# Patient Record
Sex: Female | Born: 1950 | Race: White | Hispanic: No | Marital: Married | Smoking: Never smoker
Health system: Southern US, Community
[De-identification: ages and names within clinical notes are randomized; demographics above are authoritative.]

## PROBLEM LIST (undated history)

## (undated) DIAGNOSIS — I10 Essential (primary) hypertension: Secondary | ICD-10-CM

## (undated) DIAGNOSIS — Q2112 Patent foramen ovale: Secondary | ICD-10-CM

## (undated) DIAGNOSIS — E785 Hyperlipidemia, unspecified: Secondary | ICD-10-CM

## (undated) DIAGNOSIS — R7303 Prediabetes: Secondary | ICD-10-CM

## (undated) DIAGNOSIS — R0600 Dyspnea, unspecified: Secondary | ICD-10-CM

## (undated) DIAGNOSIS — J45909 Unspecified asthma, uncomplicated: Secondary | ICD-10-CM

## (undated) HISTORY — DX: Patent foramen ovale: Q21.12

## (undated) HISTORY — DX: Essential (primary) hypertension: I10

## (undated) HISTORY — DX: Hyperlipidemia, unspecified: E78.5

## (undated) HISTORY — DX: Dyspnea, unspecified: R06.00

## (undated) HISTORY — DX: Morbid (severe) obesity due to excess calories: E66.01

## (undated) HISTORY — DX: Unspecified asthma, uncomplicated: J45.909

## (undated) HISTORY — DX: Prediabetes: R73.03

---

## 2017-08-18 DIAGNOSIS — J101 Influenza due to other identified influenza virus with other respiratory manifestations: Secondary | ICD-10-CM

## 2017-08-18 DIAGNOSIS — J22 Unspecified acute lower respiratory infection: Secondary | ICD-10-CM

## 2017-08-18 DIAGNOSIS — R0902 Hypoxemia: Secondary | ICD-10-CM

## 2017-08-18 DIAGNOSIS — R030 Elevated blood-pressure reading, without diagnosis of hypertension: Secondary | ICD-10-CM

## 2017-08-19 DIAGNOSIS — R0902 Hypoxemia: Secondary | ICD-10-CM | POA: Diagnosis not present

## 2017-08-19 DIAGNOSIS — R030 Elevated blood-pressure reading, without diagnosis of hypertension: Secondary | ICD-10-CM | POA: Diagnosis not present

## 2017-08-19 DIAGNOSIS — J22 Unspecified acute lower respiratory infection: Secondary | ICD-10-CM | POA: Diagnosis not present

## 2017-08-19 DIAGNOSIS — J101 Influenza due to other identified influenza virus with other respiratory manifestations: Secondary | ICD-10-CM | POA: Diagnosis not present

## 2017-08-20 DIAGNOSIS — R030 Elevated blood-pressure reading, without diagnosis of hypertension: Secondary | ICD-10-CM | POA: Diagnosis not present

## 2017-08-20 DIAGNOSIS — J22 Unspecified acute lower respiratory infection: Secondary | ICD-10-CM | POA: Diagnosis not present

## 2017-08-20 DIAGNOSIS — R0902 Hypoxemia: Secondary | ICD-10-CM | POA: Diagnosis not present

## 2017-08-20 DIAGNOSIS — J101 Influenza due to other identified influenza virus with other respiratory manifestations: Secondary | ICD-10-CM | POA: Diagnosis not present

## 2017-08-21 DIAGNOSIS — J22 Unspecified acute lower respiratory infection: Secondary | ICD-10-CM | POA: Diagnosis not present

## 2017-08-21 DIAGNOSIS — R030 Elevated blood-pressure reading, without diagnosis of hypertension: Secondary | ICD-10-CM | POA: Diagnosis not present

## 2017-08-21 DIAGNOSIS — J101 Influenza due to other identified influenza virus with other respiratory manifestations: Secondary | ICD-10-CM | POA: Diagnosis not present

## 2017-08-21 DIAGNOSIS — R0902 Hypoxemia: Secondary | ICD-10-CM | POA: Diagnosis not present

## 2017-08-22 DIAGNOSIS — R0902 Hypoxemia: Secondary | ICD-10-CM | POA: Diagnosis not present

## 2017-08-22 DIAGNOSIS — R030 Elevated blood-pressure reading, without diagnosis of hypertension: Secondary | ICD-10-CM | POA: Diagnosis not present

## 2017-08-22 DIAGNOSIS — J101 Influenza due to other identified influenza virus with other respiratory manifestations: Secondary | ICD-10-CM | POA: Diagnosis not present

## 2017-08-22 DIAGNOSIS — J22 Unspecified acute lower respiratory infection: Secondary | ICD-10-CM | POA: Diagnosis not present

## 2017-08-23 DIAGNOSIS — J22 Unspecified acute lower respiratory infection: Secondary | ICD-10-CM | POA: Diagnosis not present

## 2017-08-23 DIAGNOSIS — R030 Elevated blood-pressure reading, without diagnosis of hypertension: Secondary | ICD-10-CM | POA: Diagnosis not present

## 2017-08-23 DIAGNOSIS — R0902 Hypoxemia: Secondary | ICD-10-CM | POA: Diagnosis not present

## 2017-08-23 DIAGNOSIS — J101 Influenza due to other identified influenza virus with other respiratory manifestations: Secondary | ICD-10-CM | POA: Diagnosis not present

## 2017-08-24 DIAGNOSIS — R030 Elevated blood-pressure reading, without diagnosis of hypertension: Secondary | ICD-10-CM | POA: Diagnosis not present

## 2017-08-24 DIAGNOSIS — J22 Unspecified acute lower respiratory infection: Secondary | ICD-10-CM | POA: Diagnosis not present

## 2017-08-24 DIAGNOSIS — J101 Influenza due to other identified influenza virus with other respiratory manifestations: Secondary | ICD-10-CM | POA: Diagnosis not present

## 2017-08-24 DIAGNOSIS — R0902 Hypoxemia: Secondary | ICD-10-CM | POA: Diagnosis not present

## 2017-08-25 DIAGNOSIS — R030 Elevated blood-pressure reading, without diagnosis of hypertension: Secondary | ICD-10-CM | POA: Diagnosis not present

## 2017-08-25 DIAGNOSIS — J101 Influenza due to other identified influenza virus with other respiratory manifestations: Secondary | ICD-10-CM | POA: Diagnosis not present

## 2017-08-25 DIAGNOSIS — R0902 Hypoxemia: Secondary | ICD-10-CM | POA: Diagnosis not present

## 2017-08-25 DIAGNOSIS — J22 Unspecified acute lower respiratory infection: Secondary | ICD-10-CM | POA: Diagnosis not present

## 2019-08-30 DIAGNOSIS — I2510 Atherosclerotic heart disease of native coronary artery without angina pectoris: Secondary | ICD-10-CM | POA: Insufficient documentation

## 2019-08-30 HISTORY — DX: Atherosclerotic heart disease of native coronary artery without angina pectoris: I25.10

## 2019-12-31 DIAGNOSIS — I351 Nonrheumatic aortic (valve) insufficiency: Secondary | ICD-10-CM | POA: Diagnosis not present

## 2020-04-20 ENCOUNTER — Other Ambulatory Visit: Payer: Self-pay

## 2020-04-20 DIAGNOSIS — R0600 Dyspnea, unspecified: Secondary | ICD-10-CM | POA: Insufficient documentation

## 2020-04-20 DIAGNOSIS — J45909 Unspecified asthma, uncomplicated: Secondary | ICD-10-CM | POA: Insufficient documentation

## 2020-04-20 DIAGNOSIS — R7303 Prediabetes: Secondary | ICD-10-CM | POA: Insufficient documentation

## 2020-04-21 NOTE — Progress Notes (Signed)
Cardiology Office Note:    Date:  04/22/2020   ID:  Kristin Patel, DOB 1951-07-11, MRN [id number]  PCP:  Gordy Councilman, FNP  Cardiologist:  Norman Herrlich, MD   Referring MD: Marcellus Scott, MD  ASSESSMENT:    1. Shortness of breath   2. Intermittent asthma, unspecified asthma severity, unspecified whether complicated   3. Morbid obesity (HCC)   4. Coronary artery calcification seen on CAT scan    PLAN:    In order of problems listed above:  1. She has asthma presently improved with her bronchodilators, I reviewed her echocardiogram reviewed the patient is normal for age.  I find nothing clinically to tell me she has heart failure recheck a proBNP level and also do a D-dimer to screen her for venous thromboembolism.  For completeness I asked her to repeat a chest x-ray has been 1 year with her occupational exposure I will do routine labs including a CBC and thyroids recheck a proBNP level.  I will ask her to come back and see me in the office afterwards in 6 weeks.  At this time I would recommend left or right heart catheterization. 2. Improved continue current bronchodilator 3. May be related to her shortness of breath with obesity related pulmonary disease 4. I accessed a chest CTA she had done at [facility] 08/20/2017 she had three-vessel coronary artery calcification and findings of diffuse bronchial wall thickening with peribronchial opacification and some areas of groundglass opacity upper lobes that were felt to be due to most likely air trapping.  I spoke with the patient and told her this change my opinion her shortness of breath may be anginal equivalent I think she is at higher risk than I initially thought with her prediabetes and we discussed evaluation for CAD and should be set up for cardiac CTA to be performed in [place] and also give [place] a follow-up CT scan of her chest to look for any chronic scarring and will cancel the chest x-ray initially to order.  I discussed  this with the patient shared decision making she agrees and opts for CTA of the chest.  Next appointment 6 weeks  Medication Adjustments/Labs and Tests Ordered: Current medicines are reviewed at length with the patient today.  Concerns regarding medicines are outlined above.  Orders Placed This Encounter  Procedures  . Pro b natriuretic peptide (BNP)  . TSH+T4F+T3Free  . Comprehensive metabolic panel  . D-dimer, quantitative (not at [facility])  . EKG 12-Lead   No orders of the defined types were placed in this encounter.    No chief complaint on file.   History of Present Illness:    Kristin Patel is a 69 y.o. female who is being seen today for the evaluation of shortness of breath and low oxygen.  At the request of Marcellus Scott, MD.  I reviewed notes from pulmonary office 03/18/2020 history of asthma. She has exertional shortness of breath. She had PFTs performed which showed restrictive lung disease with a differential diagnosis of heart failure sarcoidosis and pulmonary fibrosis. She had an echocardiogram at [facility] 12/31/2019. Left ventricular size and function was normal right ventricular size and function was normal. Both the left and right atrium were normal in size. Valvular abnormality included mild aortic regurgitation.  Onset 3 years ago admitted to the hospital with influenza she tells me she was there for 1 week she received antibiotics oxygen supplemental with a low oxygen saturation and ever since then she has been short  of breath and is developed asthma.  Recently she is better using Advair has not needed her rescue inhaler wheezes very little and she short of breath only when she uses her upper extremities doing heavy housework.  No edema orthopnea chest pain palpitation or syncope is not checking her oxygen sats at home regularly in my office today ambulating her lowest oxygen saturation is 95%.  She had echocardiogram performed at [facility] that is normal for  age.  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  Her husband was in the [employer] he has no occupational exposure to asbestos.  She has no known history of congenital or rheumatic heart disease.  She does not snore has not had a sleep study performed.  Only medication at this time is her Advair inhaler.  I accessed the [facility] record from 08/25/2017.  Troponins were checked they were normal there is a normal proBNP level of 98 her discharge diagnosis was influenza A with acute respiratory failure requiring supplemental oxygen initially 10 L nasal cannula.  There is no chest x-ray report.  CBC showed a hemoglobin of 13.9 Past Medical History:  Diagnosis Date  . Asthma   . Dyspnea   . Morbid obesity (HCC)   . Pre-diabetes     Past Surgical History:  Procedure Laterality Date  . CESAREAN SECTION      Current Medications: Current Meds  Medication Sig  . Aspirin-Acetaminophen-Caffeine (GOODY HEADACHE PO) Take 1 packet by mouth every 6 (six) hours as needed.  . Fluticasone-Salmeterol (ADVAIR DISKUS) 100-50 MCG/DOSE AEPB Inhale 2 puffs into the lungs 2 (two) times daily.  . Ibuprofen 200 MG CAPS Take 1 capsule by mouth every 6 (six) hours as needed.  . Multiple Vitamins-Minerals (CENTRUM SILVER 50+WOMEN) TABS Take 1 tablet by mouth daily.     Allergies:   Patient has no known allergies.   Social History   Socioeconomic History  . Marital status: Married    Spouse name: Not on file  . Number of children: Not on file  . Years of education: Not on file  . Highest education level: Not on file  Occupational History  . Not on file  Tobacco Use  . Smoking status: Never Smoker  . Smokeless tobacco: Never Used  Substance and Sexual Activity  . Alcohol use: Never  . Drug use: Never  . Sexual activity: Not on file  Other Topics Concern   . Not on file  Social History Narrative  . Not on file   Social Determinants of Health   Financial Resource Strain:   . Difficulty of Paying Living Expenses: Not on file  Food Insecurity:   . Worried About Programme researcher, broadcasting/film/video in the Last Year: Not on file  . Ran Out of Food in the Last Year: Not on file  Transportation Needs:   . Lack of Transportation (Medical): Not on file  . Lack of Transportation (Non-Medical): Not on file  Physical Activity:   . Days of Exercise per Week: Not on file  . Minutes of Exercise per Session: Not on file  Stress:   . Feeling of Stress : Not on file  Social Connections:   . Frequency of Communication with Friends and Family: Not on file  . Frequency of Social Gatherings with Friends and Family: Not on file  . Attends Religious  Services: Not on file  . Active Member of Clubs or Organizations: Not on file  . Attends Banker Meetings: Not on file  . Marital Status: Not on file     Family History: The patient's family history includes Congestive Heart Failure in her mother; Diabetes in her sister; Heart disease in her brother; Hypertension in her mother; Kidney cancer in her sister; Pancreatic cancer in her father.  ROS:   ROS Please see the history of present illness.     All other systems reviewed and are negative.  EKGs/Labs/Other Studies Reviewed:    The following studies were reviewed today:   EKG:  EKG is  ordered today.  The ekg ordered today is personally reviewed and demonstrates sinus rhythm abnormal R wave progression consider previous anterior MI  Recent Labs: She relates no recent lab work in the last year  Physical Exam:    VS:  BP (!) 146/84   Pulse 86   Ht 5\' 4"  (1.626 m)   Wt 214 lb 12.8 oz (97.4 kg)   SpO2 95%   BMI 36.87 kg/m     Wt Readings from Last 3 Encounters:  04/22/20 214 lb 12.8 oz (97.4 kg)     GEN: Does not look chronically ill well nourished, well developed in no acute distress HEENT:  Normal NECK: No JVD; No carotid bruits LYMPHATICS: No lymphadenopathy CARDIAC: No murmur RRR, no murmurs, rubs, gallops RESPIRATORY:  Clear to auscultation without rales, wheezing or rhonchi  ABDOMEN: Soft, non-tender, non-distended MUSCULOSKELETAL:  No edema; No deformity  SKIN: Warm and dry NEUROLOGIC:  Alert and oriented x 3 PSYCHIATRIC:  Normal affect     Signed, 04/24/20, MD  04/22/2020 9:28 AM    [facility]

## 2020-04-22 ENCOUNTER — Other Ambulatory Visit: Payer: Self-pay

## 2020-04-22 ENCOUNTER — Ambulatory Visit (INDEPENDENT_AMBULATORY_CARE_PROVIDER_SITE_OTHER): Payer: Medicare Other | Admitting: Cardiology

## 2020-04-22 ENCOUNTER — Other Ambulatory Visit: Payer: Self-pay | Admitting: Cardiology

## 2020-04-22 ENCOUNTER — Encounter: Payer: Self-pay | Admitting: Cardiology

## 2020-04-22 VITALS — BP 146/84 | HR 86 | Ht 64.0 in | Wt 214.8 lb

## 2020-04-22 DIAGNOSIS — J4520 Mild intermittent asthma, uncomplicated: Secondary | ICD-10-CM

## 2020-04-22 DIAGNOSIS — I2510 Atherosclerotic heart disease of native coronary artery without angina pectoris: Secondary | ICD-10-CM | POA: Diagnosis not present

## 2020-04-22 DIAGNOSIS — Z8349 Family history of other endocrine, nutritional and metabolic diseases: Secondary | ICD-10-CM

## 2020-04-22 DIAGNOSIS — R931 Abnormal findings on diagnostic imaging of heart and coronary circulation: Secondary | ICD-10-CM

## 2020-04-22 DIAGNOSIS — R0602 Shortness of breath: Secondary | ICD-10-CM

## 2020-04-22 DIAGNOSIS — Z1329 Encounter for screening for other suspected endocrine disorder: Secondary | ICD-10-CM

## 2020-04-22 MED ORDER — METOPROLOL TARTRATE 100 MG PO TABS: 100.0000 mg | ORAL_TABLET | Freq: Once | ORAL | 0 refills | Status: DC

## 2020-04-22 NOTE — Patient Instructions (Addendum)
Medication Instructions:  Your physician recommends that you continue on your current medications as directed. Please refer to the Current Medication list given to you today.  *If you need a refill on your cardiac medications before your next appointment, please call your pharmacy*   Lab Work: Your physician recommends that you return for lab work in: TODAY D-dimer, ProBNP, TSH, T3, T4, CMP  Within one week of your cardiac CT- BMP If you have labs (blood work) drawn today and your tests are completely normal, you will receive your results only by: Marland Kitchen MyChart Message (if you have MyChart) OR . A paper copy in the mail If you have any lab test that is abnormal or we need to change your treatment, we will call you to review the results.   Testing/Procedures: Your cardiac CT will be scheduled at the below location:   [facility] [address] [place], [place] [address] [phone]  If scheduled at [facility], please arrive at the [facility] main entrance of [facility] 30 minutes prior to test start time. Proceed to the [facility] Radiology Department (first floor) to check-in and test prep.  Please follow these instructions carefully (unless otherwise directed):  On the Night Before the Test: . Be sure to Drink plenty of water. . Do not consume any caffeinated/decaffeinated beverages or chocolate 12 hours prior to your test. . Do not take any antihistamines 12 hours prior to your test.  On the Day of the Test: . Drink plenty of water. Do not drink any water within one hour of the test. . Do not eat any food 4 hours prior to the test. . You may take your regular medications prior to the test.  . Take metoprolol (Lopressor) two hours prior to test. . FEMALES- please wear underwire-free bra if available       After the Test: . Drink plenty of water. . After receiving IV contrast, you may experience a mild flushed feeling. This is normal. . On  occasion, you may experience a mild rash up to 24 hours after the test. This is not dangerous. If this occurs, you can take Benadryl 25 mg and increase your fluid intake. . If you experience trouble breathing, this can be serious. If it is severe call 911 IMMEDIATELY. If it is mild, please call our office. . If you take any of these medications: Glipizide/Metformin, Avandament, Glucavance, please do not take 48 hours after completing test unless otherwise instructed.   Once we have confirmed authorization from your insurance company, we will call you to set up a date and time for your test. Based on how quickly your insurance processes prior authorizations requests, please allow up to 4 weeks to be contacted for scheduling your Cardiac CT appointment. Be advised that routine Cardiac CT appointments could be scheduled as many as 8 weeks after your provider has ordered it.  For non-scheduling related questions, please contact the cardiac imaging nurse navigator should you have any questions/concerns: Marchia Bond, Cardiac Imaging Nurse Navigator Burley Saver, Interim Cardiac Imaging Nurse [place] [facility] Direct Office Dial: [phone]   For scheduling needs, including cancellations and rescheduling, please call Vivien Rota at [phone], option 3.      Follow-Up: At [facility], you and your health needs are our priority.  As part of our continuing mission to provide you with exceptional heart care, we have created designated Provider Care Teams.  These Care Teams include your primary  Cardiologist (physician) and Advanced Practice Providers (APPs -  Physician Assistants and Nurse Practitioners) who all work together to provide you with the care you need, when you need it.  We recommend signing up for the patient portal called "MyChart".  Sign up information is provided on this After Visit Summary.  MyChart is used to connect with patients for Virtual Visits  (Telemedicine).  Patients are able to view lab/test results, encounter notes, upcoming appointments, etc.  Non-urgent messages can be sent to your provider as well.   To learn more about what you can do with MyChart, go to [email or web address].    Your next appointment:   6 week(s)  The format for your next appointment:   In Person  Provider:   Shirlee More, MD   Other Instructions

## 2020-04-23 LAB — COMPREHENSIVE METABOLIC PANEL
ALT: 16 IU/L (ref 0–32)
AST: 17 IU/L (ref 0–40)
Albumin/Globulin Ratio: 1.5 (ref 1.2–2.2)
Albumin: 4.5 g/dL (ref 3.8–4.8)
Alkaline Phosphatase: 77 IU/L (ref 48–121)
BUN/Creatinine Ratio: 17 (ref 12–28)
BUN: 11 mg/dL (ref 8–27)
Bilirubin Total: <0.2 mg/dL (ref 0.0–1.2)
CO2: 22 mmol/L (ref 20–29)
Calcium: 9.9 mg/dL (ref 8.7–10.3)
Chloride: 102 mmol/L (ref 96–106)
Creatinine, Ser: 0.66 mg/dL (ref 0.57–1.00)
GFR calc Af Amer: 104 mL/min/{1.73_m2} (ref >59)
GFR calc non Af Amer: 90 mL/min/{1.73_m2} (ref >59)
Globulin, Total: 3.1 g/dL (ref 1.5–4.5)
Glucose: 119 mg/dL — ABNORMAL HIGH (ref 65–99)
Potassium: 4.5 mmol/L (ref 3.5–5.2)
Sodium: 140 mmol/L (ref 134–144)
Total Protein: 7.6 g/dL (ref 6.0–8.5)

## 2020-04-23 LAB — D-DIMER, QUANTITATIVE: D-DIMER: 0.82 mg/L FEU — ABNORMAL HIGH (ref 0.00–0.49)

## 2020-04-23 LAB — TSH+T4F+T3FREE
Free T4: 0.84 ng/dL (ref 0.82–1.77)
T3, Free: 2.6 pg/mL (ref 2.0–4.4)
TSH: 3.34 u[IU]/mL (ref 0.450–4.500)

## 2020-04-23 LAB — PRO B NATRIURETIC PEPTIDE: NT-Pro BNP: 52 pg/mL (ref 0–301)

## 2020-04-27 ENCOUNTER — Telehealth: Payer: Self-pay

## 2020-04-27 NOTE — Telephone Encounter (Signed)
-----   Message from Baldo Daub, MD sent at 04/27/2020  7:51 AM EDT ----- Good results except D-dimer is elevated for age  I think she should have a CTA done to exclude pulmonary embolism.

## 2020-04-27 NOTE — Telephone Encounter (Signed)
Spoke with patient regarding results and recommendation.  Patient verbalizes understanding and is agreeable to plan of care. Advised patient to call back with any issues or concerns.  

## 2020-05-01 ENCOUNTER — Telehealth: Payer: Self-pay

## 2020-05-01 NOTE — Telephone Encounter (Signed)
Per Dr. Dulce Sellar:  CTA of chest [facility] 04/28/2020 good result no findings of pulmonary embolism  Spoke with patient regarding results and recommendation.  Patient verbalizes understanding and is agreeable to plan of care. Advised patient to call back with any issues or concerns.

## 2020-05-07 ENCOUNTER — Telehealth (HOSPITAL_COMMUNITY): Payer: Self-pay | Admitting: *Deleted

## 2020-05-07 ENCOUNTER — Telehealth: Payer: Self-pay

## 2020-05-07 DIAGNOSIS — Z79899 Other long term (current) drug therapy: Secondary | ICD-10-CM

## 2020-05-07 LAB — BASIC METABOLIC PANEL
BUN/Creatinine Ratio: 19 (ref 12–28)
BUN: 12 mg/dL (ref 8–27)
CO2: 21 mmol/L (ref 20–29)
Calcium: 9.9 mg/dL (ref 8.7–10.3)
Chloride: 100 mmol/L (ref 96–106)
Creatinine, Ser: 0.63 mg/dL (ref 0.57–1.00)
GFR calc Af Amer: 105 mL/min/{1.73_m2} (ref >59)
GFR calc non Af Amer: 91 mL/min/{1.73_m2} (ref >59)
Glucose: 118 mg/dL — ABNORMAL HIGH (ref 65–99)
Potassium: 4.5 mmol/L (ref 3.5–5.2)
Sodium: 139 mmol/L (ref 134–144)

## 2020-05-07 NOTE — Telephone Encounter (Signed)
Pt here for labs for CT tomorrow

## 2020-05-07 NOTE — Telephone Encounter (Signed)

## 2020-05-07 NOTE — Telephone Encounter (Signed)
Left message on patients voicemail letting her know her lab results and also gave her our call back number in case she had any questions or concerns.

## 2020-05-08 ENCOUNTER — Ambulatory Visit (HOSPITAL_COMMUNITY)
Admission: RE | Admit: 2020-05-08 | Discharge: 2020-05-08 | Disposition: A | Discharge status: Home / Self Care | Payer: Medicare Other | Source: Ambulatory Visit | Attending: Cardiology | Admitting: Cardiology

## 2020-05-08 ENCOUNTER — Other Ambulatory Visit: Payer: Self-pay

## 2020-05-08 DIAGNOSIS — I2510 Atherosclerotic heart disease of native coronary artery without angina pectoris: Secondary | ICD-10-CM

## 2020-05-08 DIAGNOSIS — J4520 Mild intermittent asthma, uncomplicated: Secondary | ICD-10-CM

## 2020-05-08 DIAGNOSIS — R0602 Shortness of breath: Secondary | ICD-10-CM

## 2020-05-08 DIAGNOSIS — I700 Atherosclerosis of aorta: Secondary | ICD-10-CM

## 2020-05-08 MED ORDER — IOHEXOL 350 MG/ML SOLN
80.0000 mL | Freq: Once | INTRAVENOUS | Status: AC | PRN
  Administered 2020-05-08: 80 mL via INTRAVENOUS

## 2020-05-08 MED ORDER — NITROGLYCERIN 0.4 MG SL SUBL
SUBLINGUAL_TABLET | SUBLINGUAL | Status: AC
  Filled 2020-05-08: qty 2

## 2020-05-08 MED ORDER — NITROGLYCERIN 0.4 MG SL SUBL
0.8000 mg | SUBLINGUAL_TABLET | Freq: Once | SUBLINGUAL | Status: AC
  Administered 2020-05-08: 0.8 mg via SUBLINGUAL

## 2020-05-11 ENCOUNTER — Telehealth: Payer: Self-pay

## 2020-05-11 MED ORDER — ATORVASTATIN CALCIUM 20 MG PO TABS: 20.0000 mg | ORAL_TABLET | Freq: Every day | ORAL | 3 refills | Status: DC

## 2020-05-11 NOTE — Telephone Encounter (Signed)
Spoke with patient regarding results and recommendation.  Patient verbalizes understanding and is agreeable to plan of care. Advised patient to call back with any issues or concerns.  

## 2020-05-11 NOTE — Telephone Encounter (Signed)
-----   Message from Thomasene Ripple, DO sent at 05/10/2020 12:55 PM EDT -----  You do blockages in your heart.  Nothing enough to warrant any further testing.   I want you to take aspirin 81 mg daily as well as started on Lipitor 20 mg daily.   There is incidental finding of patent foramen ovale - which we do need to do anything further about.   Dr. Dulce Sellar with discussed this with you more detail at your next visit.   I will also for your CT to your PCP   there are some noncardiac findings in there that needs to be follow-up

## 2020-06-08 NOTE — Progress Notes (Signed)
Cardiology Office Note:    Date:  06/09/2020   ID:  Kristin Patel, DOB 1950-10-28, MRN [id number]  PCP:  Gordy Councilman, FNP  Cardiologist:  Norman Herrlich, MD    Referring MD: Gordy Councilman, FNP    ASSESSMENT:    1. Mild CAD   2. Shortness of breath   3. Hyperlipidemia, unspecified hyperlipidemia type   4. Hypertension, unspecified type    PLAN:    In order of problems listed above:  1. Cardiac CTA shows mild nonobstructive CAD she has exertional shortness of breath which is multifactorial but may be related with microvascular coronary disease initiate treatment aspirin continue statin started last month and rate limiting calcium channel blocker for coronary vasodilation.  To be given a prescription for nitroglycerin to have at home. 2. Multifactorial with asthma obesity but also mild CAD see above 3. Continue statin with CAD check lipid profile CMP 4. Initiate antihypertensive therapy with stage II hypertension   Next appointment: 6 months   Medication Adjustments/Labs and Tests Ordered: Current medicines are reviewed at length with the patient today.  Concerns regarding medicines are outlined above.  No orders of the defined types were placed in this encounter.  No orders of the defined types were placed in this encounter.   Chief Complaint  Patient presents with  . Follow-up    After cardiac CTA    History of Present Illness:    Kristin Patel is a 69 y.o. female with a hx of asthma abnormal PFTs with restrictive pattern and coronary artery calcification on chest CT performed at [facility] in December 2018. Marland Kitchen She was last seen 04/22/2020. She had an echocardiogram at [facility] 12/31/2019. Left ventricular size and function was normal right ventricular size and function was normal. Both the left and right atrium were normal in size and mild aortic regurgitation. Her N-terminal proBNP level was very low not consistent with heart failure and D-dimer was  elevated for age.  Compliance with diet, lifestyle and medications: Yes  Reviewed her CTA findings with her.  She is not having angina but with physical effort she is short of breath at rest and is recovered this could be related to her CAD blood pressure me rechecking is 162/90 we will continue treatment low-dose aspirin statin check lipids today initiate antihypertensive with a rate limiting calcium channel blocker of mild CAD and asthma.  I have asked her to check blood pressure at home daily for few weeks and drop a list to my office.  For further evaluation she underwent a cardiac CTA 05/08/2020 with a calcium score of 35, 66 percentile for age and sex and mild nonobstructive CAD.  There was also abnormality noted in the left breast similar to a previous study.  She had a CTA of the chest 04/29/2020 pulmonary embolism protocol [facility] health showing no evidence of pulmonary embolism and normal lung appearance.  Past Medical History:  Diagnosis Date  . Asthma   . Dyspnea   . Morbid obesity (HCC)   . Pre-diabetes     Past Surgical History:  Procedure Laterality Date  . CESAREAN SECTION      Current Medications: Current Meds  Medication Sig  . Aspirin-Acetaminophen-Caffeine (GOODY HEADACHE PO) Take 1 packet by mouth every 6 (six) hours as needed.  Marland Kitchen atorvastatin (LIPITOR) 20 MG tablet Take 1 tablet (20 mg total) by mouth daily.  . Fluticasone-Salmeterol (ADVAIR DISKUS) 100-50 MCG/DOSE AEPB Inhale 2 puffs into the lungs 2 (two) times daily.  Marland Kitchen  Ibuprofen 200 MG CAPS Take 1 capsule by mouth every 6 (six) hours as needed.  . Multiple Vitamins-Minerals (CENTRUM SILVER 50+WOMEN) TABS Take 1 tablet by mouth daily.     Allergies:   Patient has no known allergies.   Social History   Socioeconomic History  . Marital status: Married    Spouse name: Not on file  . Number of children: Not on file  . Years of education: Not on file  . Highest education level: Not on file  Occupational  History  . Not on file  Tobacco Use  . Smoking status: Never Smoker  . Smokeless tobacco: Never Used  Substance and Sexual Activity  . Alcohol use: Never  . Drug use: Never  . Sexual activity: Not on file  Other Topics Concern  . Not on file  Social History Narrative  . Not on file   Social Determinants of Health   Financial Resource Strain:   . Difficulty of Paying Living Expenses: Not on file  Food Insecurity:   . Worried About Programme researcher, broadcasting/film/video in the Last Year: Not on file  . Ran Out of Food in the Last Year: Not on file  Transportation Needs:   . Lack of Transportation (Medical): Not on file  . Lack of Transportation (Non-Medical): Not on file  Physical Activity:   . Days of Exercise per Week: Not on file  . Minutes of Exercise per Session: Not on file  Stress:   . Feeling of Stress : Not on file  Social Connections:   . Frequency of Communication with Friends and Family: Not on file  . Frequency of Social Gatherings with Friends and Family: Not on file  . Attends Religious Services: Not on file  . Active Member of Clubs or Organizations: Not on file  . Attends Banker Meetings: Not on file  . Marital Status: Not on file     Family History: The patient's family history includes Congestive Heart Failure in her mother; Diabetes in her sister; Heart disease in her brother; Hypertension in her mother; Kidney cancer in her sister; Pancreatic cancer in her father. ROS:   Please see the history of present illness.    All other systems reviewed and are negative.  EKGs/Labs/Other Studies Reviewed:    The following studies were reviewed today:    Recent Labs: 04/22/2020: ALT 16; NT-Pro BNP 52; TSH 3.340 05/07/2020: BUN 12; Creatinine, Ser 0.63; Potassium 4.5; Sodium 139  Recent Lipid Panel No results found for: CHOL, TRIG, HDL, CHOLHDL, VLDL, LDLCALC, LDLDIRECT  Physical Exam:    VS:  BP (!) 145/85   Pulse 80   Ht 5\' 4"  (1.626 m)   Wt 213 lb 6.4 oz  (96.8 kg)   SpO2 92%   BMI 36.63 kg/m     Wt Readings from Last 3 Encounters:  06/09/20 213 lb 6.4 oz (96.8 kg)  04/22/20 214 lb 12.8 oz (97.4 kg)     GEN:  Well nourished, well developed in no acute distress HEENT: Normal NECK: No JVD; No carotid bruits LYMPHATICS: No lymphadenopathy CARDIAC: RRR, no murmurs, rubs, gallops RESPIRATORY:  Clear to auscultation without rales, wheezing or rhonchi  ABDOMEN: Soft, non-tender, non-distended MUSCULOSKELETAL:  No edema; No deformity  SKIN: Warm and dry NEUROLOGIC:  Alert and oriented x 3 PSYCHIATRIC:  Normal affect    Signed, 04/24/20, MD  06/09/2020 8:58 AM    [facility]

## 2020-06-09 ENCOUNTER — Ambulatory Visit (INDEPENDENT_AMBULATORY_CARE_PROVIDER_SITE_OTHER): Payer: Medicare Other | Admitting: Cardiology

## 2020-06-09 ENCOUNTER — Encounter: Payer: Self-pay | Admitting: Cardiology

## 2020-06-09 ENCOUNTER — Other Ambulatory Visit: Payer: Self-pay

## 2020-06-09 VITALS — BP 145/85 | HR 80 | Ht 64.0 in | Wt 213.4 lb

## 2020-06-09 DIAGNOSIS — E785 Hyperlipidemia, unspecified: Secondary | ICD-10-CM | POA: Diagnosis not present

## 2020-06-09 DIAGNOSIS — I2510 Atherosclerotic heart disease of native coronary artery without angina pectoris: Secondary | ICD-10-CM

## 2020-06-09 DIAGNOSIS — R0602 Shortness of breath: Secondary | ICD-10-CM

## 2020-06-09 DIAGNOSIS — I10 Essential (primary) hypertension: Secondary | ICD-10-CM

## 2020-06-09 LAB — COMPREHENSIVE METABOLIC PANEL
ALT: 18 IU/L (ref 0–32)
AST: 15 IU/L (ref 0–40)
Albumin/Globulin Ratio: 1.8 (ref 1.2–2.2)
Albumin: 4.6 g/dL (ref 3.8–4.8)
Alkaline Phosphatase: 85 IU/L (ref 44–121)
BUN/Creatinine Ratio: 18 (ref 12–28)
BUN: 12 mg/dL (ref 8–27)
Bilirubin Total: 0.3 mg/dL (ref 0.0–1.2)
CO2: 25 mmol/L (ref 20–29)
Calcium: 9.7 mg/dL (ref 8.7–10.3)
Chloride: 101 mmol/L (ref 96–106)
Creatinine, Ser: 0.67 mg/dL (ref 0.57–1.00)
GFR calc Af Amer: 103 mL/min/{1.73_m2} (ref >59)
GFR calc non Af Amer: 89 mL/min/{1.73_m2} (ref >59)
Globulin, Total: 2.5 g/dL (ref 1.5–4.5)
Glucose: 120 mg/dL — ABNORMAL HIGH (ref 65–99)
Potassium: 4.6 mmol/L (ref 3.5–5.2)
Sodium: 141 mmol/L (ref 134–144)
Total Protein: 7.1 g/dL (ref 6.0–8.5)

## 2020-06-09 LAB — LIPID PANEL
Chol/HDL Ratio: 3.3 ratio (ref 0.0–4.4)
Cholesterol, Total: 145 mg/dL (ref 100–199)
HDL: 44 mg/dL (ref >39)
LDL Chol Calc (NIH): 71 mg/dL (ref 0–99)
Triglycerides: 179 mg/dL — ABNORMAL HIGH (ref 0–149)
VLDL Cholesterol Cal: 30 mg/dL (ref 5–40)

## 2020-06-09 MED ORDER — NITROGLYCERIN 0.4 MG SL SUBL: 0.4000 mg | SUBLINGUAL_TABLET | SUBLINGUAL | 3 refills | Status: DC | PRN

## 2020-06-09 MED ORDER — DILTIAZEM HCL ER COATED BEADS 180 MG PO CP24: 180.0000 mg | ORAL_CAPSULE | Freq: Every day | ORAL | 3 refills | Status: DC

## 2020-06-09 NOTE — Patient Instructions (Addendum)
Medication Instructions:  Your physician has recommended you make the following change in your medication:  START: Cardizem 180 mg take one tablet by mouth daily.  START: Nitroglycerin 0.4 mg take one tablet by mouth every 5 minutes up to three times as needed for chest pain.  *If you need a refill on your cardiac medications before your next appointment, please call your pharmacy*   Lab Work: Your physician recommends that you return for lab work in: TODAY CMP, Lipids If you have labs (blood work) drawn today and your tests are completely normal, you will receive your results only by: Marland Kitchen MyChart Message (if you have MyChart) OR . A paper copy in the mail If you have any lab test that is abnormal or we need to change your treatment, we will call you to review the results.   Testing/Procedures: None   Follow-Up: At [facility], you and your health needs are our priority.  As part of our continuing mission to provide you with exceptional heart care, we have created designated Provider Care Teams.  These Care Teams include your primary Cardiologist (physician) and Advanced Practice Providers (APPs -  Physician Assistants and Nurse Practitioners) who all work together to provide you with the care you need, when you need it.  We recommend signing up for the patient portal called "MyChart".  Sign up information is provided on this After Visit Summary.  MyChart is used to connect with patients for Virtual Visits (Telemedicine).  Patients are able to view lab/test results, encounter notes, upcoming appointments, etc.  Non-urgent messages can be sent to your provider as well.   To learn more about what you can do with MyChart, go to [email or web address].    Your next appointment:   6 month(s)  The format for your next appointment:   In Person  Provider:   Norman Herrlich, MD   Other Instructions  Tips to measure your blood pressure correctly  To determine whether you have  hypertension, a medical professional will take a blood pressure reading. How you prepare for the test, the position of your arm, and other factors can change a blood pressure reading by 10% or more. That could be enough to hide high blood pressure, start you on a drug you don't really need, or lead your doctor to incorrectly adjust your medications. National and international guidelines offer specific instructions for measuring blood pressure. If a doctor, nurse, or medical assistant isn't doing it right, don't hesitate to ask him or her to get with the guidelines. Here's what you can do to ensure a correct reading: . Don't drink a caffeinated beverage or smoke during the 30 minutes before the test. . Sit quietly for five minutes before the test begins. . During the measurement, sit in a chair with your feet on the floor and your arm supported so your elbow is at about heart level. . The inflatable part of the cuff should completely cover at least 80% of your upper arm, and the cuff should be placed on bare skin, not over a shirt. . Don't talk during the measurement. . Have your blood pressure measured twice, with a brief break in between. If the readings are different by 5 points or more, have it done a third time. There are times to break these rules. If you sometimes feel lightheaded when getting out of bed in the morning or when you stand after sitting, you should have your blood pressure checked while seated and then while standing to  see if it falls from one position to the next. Because blood pressure varies throughout the day, your doctor will rarely diagnose hypertension on the basis of a single reading. Instead, he or she will want to confirm the measurements on at least two occasions, usually within a few weeks of one another. The exception to this rule is if you have a blood pressure reading of 180/110 mm Hg or higher. A result this high usually calls for prompt treatment. It's also a good  idea to have your blood pressure measured in both arms at least once, since the reading in one arm (usually the right) may be higher than that in the left. A 2014 study in The American Journal of Medicine of nearly 3,400 people found average arm- to-arm differences in systolic blood pressure of about 5 points. The higher number should be used to make treatment decisions. In 2017, new guidelines from the American Heart Association, the [employer] of [employer], and nine other health organizations lowered the diagnosis of high blood pressure to 130/80 mm Hg or higher for all adults. The guidelines also redefined the various blood pressure categories to now include normal, elevated, Stage 1 hypertension, Stage 2 hypertension, and hypertensive crisis (see "Blood pressure categories"). Blood pressure categories  Blood pressure category SYSTOLIC (upper number)  DIASTOLIC (lower number)  Normal Less than 120 mm Hg and Less than 80 mm Hg  Elevated 120-129 mm Hg and Less than 80 mm Hg  High blood pressure: Stage 1 hypertension 130-139 mm Hg or 80-89 mm Hg  High blood pressure: Stage 2 hypertension 140 mm Hg or higher or 90 mm Hg or higher  Hypertensive crisis (consult your doctor immediately) Higher than 180 mm Hg and/or Higher than 120 mm Hg  Source: American Heart Association and American Stroke Association. For more on getting your blood pressure under control, buy Controlling Your Blood Pressure, a Special Health Report from [facility].DASH diet: Healthy eating to lower your blood pressure The DASH diet emphasizes portion size, eating a variety of foods and getting the right amount of nutrients. Discover how DASH can improve your health and lower your blood pressure. By [facility] Staff  DASH stands for Dietary Approaches to Stop Hypertension. The DASH diet is a lifelong approach to healthy eating that's designed to help treat or prevent high blood pressure (hypertension). The DASH diet  encourages you to reduce the sodium in your diet and eat a variety of foods rich in nutrients that help lower blood pressure, such as potassium, calcium and magnesium. By following the DASH diet, you may be able to reduce your blood pressure by a few points in just two weeks. Over time, your systolic blood pressure could drop by eight to 14 points, which can make a significant difference in your health risks. Because the DASH diet is a healthy way of eating, it offers health benefits besides just lowering blood pressure. The DASH diet is also in line with dietary recommendations to prevent osteoporosis, cancer, heart disease, stroke and diabetes. DASH diet: Sodium levels The DASH diet emphasizes vegetables, fruits and low-fat dairy foods -- and moderate amounts of whole grains, fish, poultry and nuts. In addition to the standard DASH diet, there is also a lower sodium version of the diet. You can choose the version of the diet that meets your health needs: Standard DASH diet. You can consume up to 2,300 milligrams (mg) of sodium a day.  Lower sodium DASH diet. You can consume up  to 1,500 mg of sodium a day. Both versions of the DASH diet aim to reduce the amount of sodium in your diet compared with what you might get in a typical American diet, which can amount to a whopping 3,400 mg of sodium a day or more. The standard DASH diet meets the recommendation from the Dietary Guidelines for Americans to keep daily sodium intake to less than 2,300 mg a day. The American Heart Association recommends 1,500 mg a day of sodium as an upper limit for all adults. If you aren't sure what sodium level is right for you, talk to your doctor. DASH diet: What to eat Both versions of the DASH diet include lots of whole grains, fruits, vegetables and low-fat dairy products. The DASH diet also includes some fish, poultry and legumes, and encourages a small amount of nuts and seeds a few times a week.  You can eat red meat,  sweets and fats in small amounts. The DASH diet is low in saturated fat, cholesterol and total fat. Here's a look at the recommended servings from each food group for the 2,000-calorie-a-day DASH diet. Grains: 6 to 8 servings a day Grains include bread, cereal, rice and pasta. Examples of one serving of grains include 1 slice whole-wheat bread, 1 ounce dry cereal, or 1/2 cup cooked cereal, rice or pasta. Focus on whole grains because they have more fiber and nutrients than do refined grains. For instance, use brown rice instead of white rice, whole-wheat pasta instead of regular pasta and whole-grain bread instead of white bread. Look for products labeled "100 percent whole grain" or "100 percent whole wheat."  Grains are naturally low in fat. Keep them this way by avoiding butter, cream and cheese sauces. Vegetables: 4 to 5 servings a day Tomatoes, carrots, broccoli, sweet potatoes, greens and other vegetables are full of fiber, vitamins, and such minerals as potassium and magnesium. Examples of one serving include 1 cup raw leafy green vegetables or 1/2 cup cut-up raw or cooked vegetables. Don't think of vegetables only as side dishes -- a hearty blend of vegetables served over brown rice or whole-wheat noodles can serve as the main dish for a meal.  Fresh and frozen vegetables are both good choices. When buying frozen and canned vegetables, choose those labeled as low sodium or without added salt.  To increase the number of servings you fit in daily, be creative. In a stir-fry, for instance, cut the amount of meat in half and double up on the vegetables. Fruits: 4 to 5 servings a day Many fruits need little preparation to become a healthy part of a meal or snack. Like vegetables, they're packed with fiber, potassium and magnesium and are typically low in fat -- coconuts are an exception. Examples of one serving include one medium fruit, 1/2 cup fresh, frozen or canned fruit, or 4 ounces of  juice. Have a piece of fruit with meals and one as a snack, then round out your day with a dessert of fresh fruits topped with a dollop of low-fat yogurt.  Leave on edible peels whenever possible. The peels of apples, pears and most fruits with pits add interesting texture to recipes and contain healthy nutrients and fiber.  Remember that citrus fruits and juices, such as grapefruit, can interact with certain medications, so check with your doctor or pharmacist to see if they're OK for you.  If you choose canned fruit or juice, make sure no sugar is added. Dairy: 2 to 3 servings  a day Milk, yogurt, cheese and other dairy products are major sources of calcium, vitamin D and protein. But the key is to make sure that you choose dairy products that are low fat or fat-free because otherwise they can be a major source of fat -- and most of it is saturated. Examples of one serving include 1 cup skim or 1 percent milk, 1 cup low fat yogurt, or 1 1/2 ounces part-skim cheese. Low-fat or fat-free frozen yogurt can help you boost the amount of dairy products you eat while offering a sweet treat. Add fruit for a healthy twist.  If you have trouble digesting dairy products, choose lactose-free products or consider taking an over-the-counter product that contains the enzyme lactase, which can reduce or prevent the symptoms of lactose intolerance.  Go easy on regular and even fat-free cheeses because they are typically high in sodium. Lean meat, poultry and fish: 6 servings or fewer a day Meat can be a rich source of protein, B vitamins, iron and zinc. Choose lean varieties and aim for no more than 6 ounces a day. Cutting back on your meat portion will allow room for more vegetables. Trim away skin and fat from poultry and meat and then bake, broil, grill or roast instead of frying in fat.  Eat heart-healthy fish, such as salmon, herring and tuna. These types of fish are high in omega-3 fatty acids, which can help  lower your total cholesterol. Nuts, seeds and legumes: 4 to 5 servings a week Almonds, sunflower seeds, kidney beans, peas, lentils and other foods in this family are good sources of magnesium, potassium and protein. They're also full of fiber and phytochemicals, which are plant compounds that may protect against some cancers and cardiovascular disease. Serving sizes are small and are intended to be consumed only a few times a week because these foods are high in calories. Examples of one serving include 1/3 cup nuts, 2 tablespoons seeds, or 1/2 cup cooked beans or peas.  Nuts sometimes get a bad rap because of their fat content, but they contain healthy types of fat -- monounsaturated fat and omega-3 fatty acids. They're high in calories, however, so eat them in moderation. Try adding them to stir-fries, salads or cereals.  Soybean-based products, such as tofu and tempeh, can be a good alternative to meat because they contain all of the amino acids your body needs to make a complete protein, just like meat. Fats and oils: 2 to 3 servings a day Fat helps your body absorb essential vitamins and helps your body's immune system. But too much fat increases your risk of heart disease, diabetes and obesity. The DASH diet strives for a healthy balance by limiting total fat to less than 30 percent of daily calories from fat, with a focus on the healthier monounsaturated fats. Examples of one serving include 1 teaspoon soft margarine, 1 tablespoon mayonnaise or 2 tablespoons salad dressing. Saturated fat and trans fat are the main dietary culprits in increasing your risk of coronary artery disease. DASH helps keep your daily saturated fat to less than 6 percent of your total calories by limiting use of meat, butter, cheese, whole milk, cream and eggs in your diet, along with foods made from lard, solid shortenings, and palm and coconut oils.  Avoid trans fat, commonly found in such processed foods as crackers,  baked goods and fried items.  Read food labels on margarine and salad dressing so that you can choose those that are lowest in  saturated fat and free of trans fat. Sweets: 5 servings or fewer a week You don't have to banish sweets entirely while following the DASH diet -- just go easy on them. Examples of one serving include 1 tablespoon sugar, jelly or jam, 1/2 cup sorbet, or 1 cup lemonade. When you eat sweets, choose those that are fat-free or low-fat, such as sorbets, fruit ices, jelly beans, hard candy, graham crackers or low-fat cookies.  Artificial sweeteners such as aspartame (NutraSweet, Equal) and sucralose (Splenda) may help satisfy your sweet tooth while sparing the sugar. But remember that you still must use them sensibly. It's OK to swap a diet cola for a regular cola, but not in place of a more nutritious beverage such as low-fat milk or even plain water.  Cut back on added sugar, which has no nutritional value but can pack on calories. DASH diet: Alcohol and caffeine Drinking too much alcohol can increase blood pressure. The Dietary Guidelines for Americans recommends that men limit alcohol to no more than two drinks a day and women to one or less. The DASH diet doesn't address caffeine consumption. The influence of caffeine on blood pressure remains unclear. But caffeine can cause your blood pressure to rise at least temporarily. If you already have high blood pressure or if you think caffeine is affecting your blood pressure, talk to your doctor about your caffeine consumption. DASH diet and weight loss While the DASH diet is not a weight-loss program, you may indeed lose unwanted pounds because it can help guide you toward healthier food choices. The DASH diet generally includes about 2,000 calories a day. If you're trying to lose weight, you may need to eat fewer calories. You may also need to adjust your serving goals based on your individual circumstances -- something your health  care team can help you decide. Tips to cut back on sodium The foods at the core of the DASH diet are naturally low in sodium. So just by following the DASH diet, you're likely to reduce your sodium intake. You also reduce sodium further by: Using sodium-free spices or flavorings with your food instead of salt  Not adding salt when cooking rice, pasta or hot cereal  Rinsing canned foods to remove some of the sodium  Buying foods labeled "no salt added," "sodium-free," "low sodium" or "very low sodium" One teaspoon of table salt has 2,325 mg of sodium. When you read food labels, you may be surprised at just how much sodium some processed foods contain. Even low-fat soups, canned vegetables, ready-to-eat cereals and sliced [place] from the local deli -- foods you may have considered healthy -- often have lots of sodium. You may notice a difference in taste when you choose low-sodium food and beverages. If things seem too bland, gradually introduce low-sodium foods and cut back on table salt until you reach your sodium goal. That'll give your palate time to adjust. Using salt-free seasoning blends or herbs and spices may also ease the transition. It can take several weeks for your taste buds to get used to less salty foods. Putting the pieces of the DASH diet together Try these strategies to get started on the DASH diet:  Change gradually. If you now eat only one or two servings of fruits or vegetables a day, try to add a serving at lunch and one at dinner. Rather than switching to all whole grains, start by making one or two of your grain servings whole grains. Increasing fruits, vegetables and whole  grains gradually can also help prevent bloating or diarrhea that may occur if you aren't used to eating a diet with lots of fiber. You can also try over-the-counter products to help reduce gas from beans and vegetables.  Reward successes and forgive slip-ups. Reward yourself with a nonfood treat for your  accomplishments -- rent a movie, purchase a book or get together with a friend. Everyone slips, especially when learning something new. Remember that changing your lifestyle is a long-term process. Find out what triggered your setback and then just pick up where you left off with the DASH diet.  Add physical activity. To boost your blood pressure lowering efforts even more, consider increasing your physical activity in addition to following the DASH diet. Combining both the DASH diet and physical activity makes it more likely that you'll reduce your blood pressure.  Get support if you need it. If you're having trouble sticking to your diet, talk to your doctor or dietitian about it. You might get some tips that will help you stick to the DASH diet. Remember, healthy eating isn't an all-or-nothing proposition. What's most important is that, on average, you eat healthier foods with plenty of variety -- both to keep your diet nutritious and to avoid boredom or extremes. And with the DASH diet, you can have both.

## 2020-06-10 ENCOUNTER — Telehealth: Payer: Self-pay

## 2020-06-10 NOTE — Telephone Encounter (Signed)
-----   Message from Baldo Daub, MD sent at 06/10/2020  7:51 AM EDT ----- Good result no changes

## 2020-06-10 NOTE — Telephone Encounter (Signed)
Spoke with patient regarding results and recommendation.  Patient verbalizes understanding and is agreeable to plan of care. Advised patient to call back with any issues or concerns.  

## 2021-01-19 NOTE — Progress Notes (Signed)
Cardiology Office Note:    Date:  01/20/2021   ID:  Kristin Patel, DOB September 01, 1950, MRN [id number]  PCP:  Gordy Councilman, FNP  Cardiologist:  Norman Herrlich, MD    Referring MD: Gordy Councilman, FNP    ASSESSMENT:    1. Mild CAD   2. Hypertension, unspecified type   3. Hyperlipidemia, unspecified hyperlipidemia type   4. Intermittent asthma, unspecified asthma severity, unspecified whether complicated    PLAN:    In order of problems listed above:  1. She continues to do well she is having no anginal discomfort is mild nonobstructive CAD and is on appropriate medical treatment including aspirin rate limiting calcium channel blocker with her asthma and a high intensity statin. 2. Stable BP at target home blood pressure runs less then 1 20-1 30 systolic 3. Continue her statin check CMP lipid profile 4. Stable asthma she has bronchodilator   Next appointment: 1 year   Medication Adjustments/Labs and Tests Ordered: Current medicines are reviewed at length with the patient today.  Concerns regarding medicines are outlined above.  Orders Placed This Encounter  Procedures  . Comprehensive metabolic panel  . Lipid panel  . EKG 12-Lead   No orders of the defined types were placed in this encounter.   Chief Complaint  Patient presents with  . Follow-up    Mild CAD nonobstructive    History of Present Illness:    Kristin Patel is a 70 y.o. female with a hx of mild CAD shortness of breath associated with asthma hyperlipidemia and hypertension  last seen 06/09/2020 and placed on a calcium channel blocker to optimize antianginal therapy.She underwent a cardiac CTA 05/08/2020 with a calcium score of 35, 66 percentile for age and sex and mild nonobstructive CAD.  There was also abnormality noted in the left breast similar to a previous study.  She had a CTA of the chest 04/29/2020 pulmonary embolism protocol [facility] health showing no evidence of pulmonary embolism and normal lung  appearance.   Compliance with diet, lifestyle and medications: Yes   Overall she was doing well she has not required nitroglycerin and her asthma is improved. She tolerates her statin without muscle pain or weakness She has had no chest pain edema shortness of breath palpitation or syncope. Past Medical History:  Diagnosis Date  . Asthma   . Dyspnea   . Morbid obesity (HCC)   . Pre-diabetes     Past Surgical History:  Procedure Laterality Date  . CESAREAN SECTION      Current Medications: Current Meds  Medication Sig  . albuterol (VENTOLIN HFA) 108 (90 Base) MCG/ACT inhaler Inhale into the lungs every 6 (six) hours as needed for wheezing or shortness of breath.  . Aspirin-Acetaminophen-Caffeine (GOODY HEADACHE PO) Take 1 packet by mouth every 6 (six) hours as needed.  . diltiazem (CARDIZEM CD) 180 MG 24 hr capsule Take 1 capsule (180 mg total) by mouth daily.  . Fluticasone-Salmeterol (ADVAIR) 100-50 MCG/DOSE AEPB Inhale 2 puffs into the lungs 2 (two) times daily.  . Ibuprofen 200 MG CAPS Take 1 capsule by mouth every 6 (six) hours as needed.  . [DISCONTINUED] Multiple Vitamins-Minerals (CENTRUM SILVER 50+WOMEN) TABS Take 1 tablet by mouth daily.     Allergies:   Patient has no known allergies.   Social History   Socioeconomic History  . Marital status: Married    Spouse name: Not on file  . Number of children: Not on file  . Years of education: Not on  file  . Highest education level: Not on file  Occupational History  . Not on file  Tobacco Use  . Smoking status: Never Smoker  . Smokeless tobacco: Never Used  Substance and Sexual Activity  . Alcohol use: Never  . Drug use: Never  . Sexual activity: Not on file  Other Topics Concern  . Not on file  Social History Narrative  . Not on file   Social Determinants of Health   Financial Resource Strain: Not on file  Food Insecurity: Not on file  Transportation Needs: Not on file  Physical Activity: Not on file   Stress: Not on file  Social Connections: Not on file     Family History: The patient's family history includes Congestive Heart Failure in her mother; Diabetes in her sister; Heart disease in her brother; Hypertension in her mother; Kidney cancer in her sister; Pancreatic cancer in her father. ROS:   Please see the history of present illness.    All other systems reviewed and are negative.  EKGs/Labs/Other Studies Reviewed:    The following studies were reviewed today:  EKG:  EKG ordered today and personally reviewed.  The ekg ordered today demonstrates sinus rhythm and remains,  Recent Labs: 04/22/2020: NT-Pro BNP 52; TSH 3.340 06/09/2020: ALT 18; BUN 12; Creatinine, Ser 0.67; Potassium 4.6; Sodium 141  Recent Lipid Panel    Component Value Date/Time   CHOL 145 06/09/2020 0903   TRIG 179 (H) 06/09/2020 0903   HDL 44 06/09/2020 0903   CHOLHDL 3.3 06/09/2020 0903   LDLCALC 71 06/09/2020 0903    Physical Exam:    VS:  BP 140/82 (BP Location: Right Arm, Patient Position: Sitting, Cuff Size: Normal)   Pulse 100   Ht 5\' 4"  (1.626 m)   Wt 215 lb 9.6 oz (97.8 kg)   SpO2 95%   BMI 37.01 kg/m     Wt Readings from Last 3 Encounters:  01/20/21 215 lb 9.6 oz (97.8 kg)  06/09/20 213 lb 6.4 oz (96.8 kg)  04/22/20 214 lb 12.8 oz (97.4 kg)     GEN:  Well nourished, well developed in no acute distress HEENT: Normal NECK: No JVD; No carotid bruits LYMPHATICS: No lymphadenopathy CARDIAC: RRR, no murmurs, rubs, gallops RESPIRATORY:  Clear to auscultation without rales, wheezing or rhonchi  ABDOMEN: Soft, non-tender, non-distended MUSCULOSKELETAL:  No edema; No deformity  SKIN: Warm and dry NEUROLOGIC:  Alert and oriented x 3 PSYCHIATRIC:  Normal affect    Signed, 04/24/20, MD  01/20/2021 11:41 AM    [facility]

## 2021-01-20 ENCOUNTER — Ambulatory Visit (INDEPENDENT_AMBULATORY_CARE_PROVIDER_SITE_OTHER): Payer: Medicare Other | Admitting: Cardiology

## 2021-01-20 ENCOUNTER — Encounter: Payer: Self-pay | Admitting: Cardiology

## 2021-01-20 ENCOUNTER — Other Ambulatory Visit: Payer: Self-pay

## 2021-01-20 VITALS — BP 138/70 | HR 100 | Ht 64.0 in | Wt 215.6 lb

## 2021-01-20 DIAGNOSIS — J4520 Mild intermittent asthma, uncomplicated: Secondary | ICD-10-CM | POA: Diagnosis not present

## 2021-01-20 DIAGNOSIS — I2510 Atherosclerotic heart disease of native coronary artery without angina pectoris: Secondary | ICD-10-CM

## 2021-01-20 DIAGNOSIS — E785 Hyperlipidemia, unspecified: Secondary | ICD-10-CM | POA: Diagnosis not present

## 2021-01-20 DIAGNOSIS — I10 Essential (primary) hypertension: Secondary | ICD-10-CM

## 2021-01-20 NOTE — Patient Instructions (Signed)

## 2021-01-21 ENCOUNTER — Telehealth: Payer: Self-pay

## 2021-01-21 LAB — COMPREHENSIVE METABOLIC PANEL
ALT: 17 IU/L (ref 0–32)
AST: 17 IU/L (ref 0–40)
Albumin/Globulin Ratio: 1.7 (ref 1.2–2.2)
Albumin: 4.7 g/dL (ref 3.8–4.8)
Alkaline Phosphatase: 103 IU/L (ref 44–121)
BUN/Creatinine Ratio: 20 (ref 12–28)
BUN: 14 mg/dL (ref 8–27)
Bilirubin Total: 0.3 mg/dL (ref 0.0–1.2)
CO2: 22 mmol/L (ref 20–29)
Calcium: 9.5 mg/dL (ref 8.7–10.3)
Chloride: 101 mmol/L (ref 96–106)
Creatinine, Ser: 0.71 mg/dL (ref 0.57–1.00)
Globulin, Total: 2.8 g/dL (ref 1.5–4.5)
Glucose: 123 mg/dL — ABNORMAL HIGH (ref 65–99)
Potassium: 4.4 mmol/L (ref 3.5–5.2)
Sodium: 141 mmol/L (ref 134–144)
Total Protein: 7.5 g/dL (ref 6.0–8.5)
eGFR: 91 mL/min/{1.73_m2} (ref >59)

## 2021-01-21 LAB — LIPID PANEL
Chol/HDL Ratio: 3.5 ratio (ref 0.0–4.4)
Cholesterol, Total: 159 mg/dL (ref 100–199)
HDL: 45 mg/dL (ref >39)
LDL Chol Calc (NIH): 84 mg/dL (ref 0–99)
Triglycerides: 177 mg/dL — ABNORMAL HIGH (ref 0–149)
VLDL Cholesterol Cal: 30 mg/dL (ref 5–40)

## 2021-01-21 NOTE — Telephone Encounter (Signed)
Spoke with patient regarding results and recommendation.  Patient verbalizes understanding and is agreeable to plan of care. Advised patient to call back with any issues or concerns.  

## 2021-01-21 NOTE — Telephone Encounter (Signed)
-----   Message from Baldo Daub, MD sent at 01/21/2021  9:04 AM EDT ----- Good/stable result no changes in treatment

## 2021-02-16 ENCOUNTER — Other Ambulatory Visit: Payer: Self-pay | Admitting: Cardiology

## 2021-02-16 NOTE — Telephone Encounter (Signed)
Refill sent to pharmacy.   

## 2021-03-16 ENCOUNTER — Other Ambulatory Visit: Payer: Self-pay | Admitting: Cardiology

## 2021-03-16 DIAGNOSIS — R0602 Shortness of breath: Secondary | ICD-10-CM

## 2021-03-16 DIAGNOSIS — I10 Essential (primary) hypertension: Secondary | ICD-10-CM

## 2021-03-16 DIAGNOSIS — I2510 Atherosclerotic heart disease of native coronary artery without angina pectoris: Secondary | ICD-10-CM

## 2021-03-16 DIAGNOSIS — E785 Hyperlipidemia, unspecified: Secondary | ICD-10-CM

## 2021-05-21 ENCOUNTER — Telehealth: Payer: Self-pay | Admitting: Cardiology

## 2021-05-21 MED ORDER — ATORVASTATIN CALCIUM 20 MG PO TABS: 20.0000 mg | ORAL_TABLET | Freq: Every day | ORAL | 3 refills | Status: DC

## 2021-05-21 NOTE — Telephone Encounter (Signed)
Patient needs refill on ATORVASATIN 20 MG sent to [facility] in [place].

## 2021-05-21 NOTE — Telephone Encounter (Signed)
Refill sent in per request.  

## 2021-10-06 IMAGING — CT CT HEART MORP W/ CTA COR W/ SCORE W/ CA W/CM &/OR W/O CM
4 of 7 series · 8 of 20 positions shown, 9 images · IV contrast (APPLIED)
Comparison: Chest CT 04/29/2020.
COMPARISON: Chest CT 04/29/2020.

Addendum:
EXAM:
OVER-READ INTERPRETATION  CT CHEST

The following report is an over-read performed by radiologist Dr.
Esteek Cadramaisoso [REDACTED] on 05/08/2020. This
over-read does not include interpretation of cardiac or coronary
anatomy or pathology. The coronary calcium score/coronary CTA
interpretation by the cardiologist is attached.
CLINICAL DATA: 69-year-old female with shortness of breath.
Cardiac/Coronary  CTA
TECHNIQUE: The patient was scanned on a [employer] Force scanner.

[Series 6: best diast 71 % · axial · 0.38mm/px · z∈[-188,-147]mm · 2 of 306 slices shown]
[im 102/306  vessel]
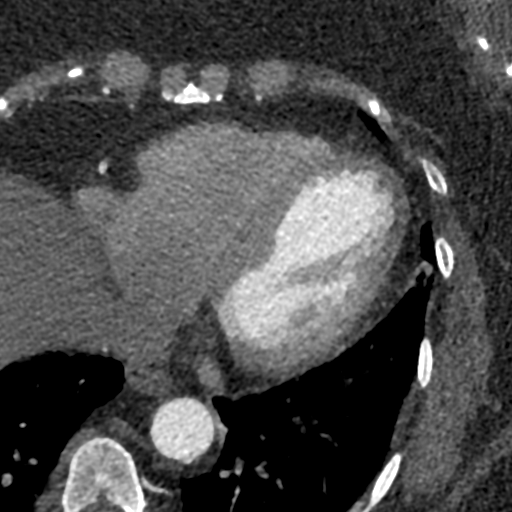
[im 204/306  vessel]
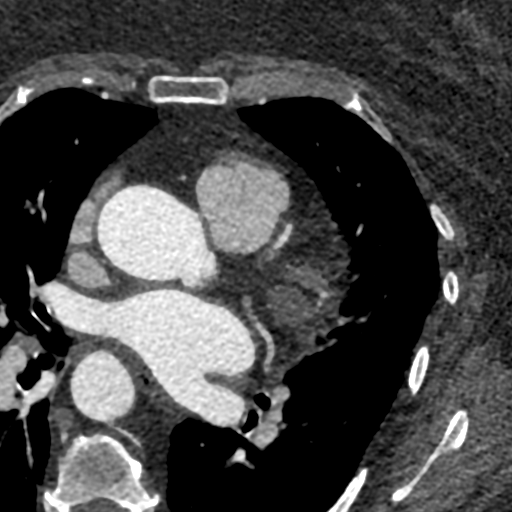

[Series 7: best syst · axial · 0.38mm/px · z∈[-188,-147]mm · 2 of 306 slices shown, 3 images]
[im 102/306  vessel]
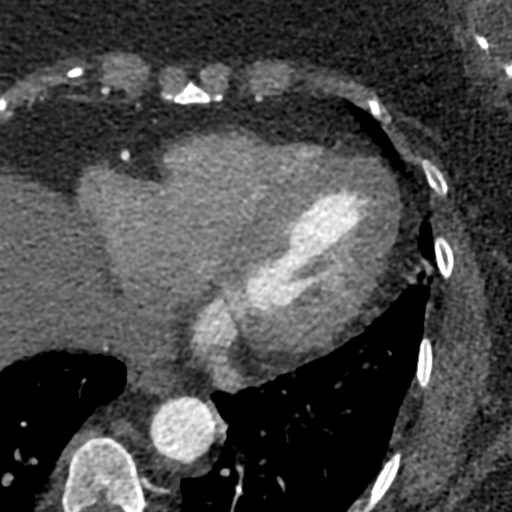
[im 102/306  lung]
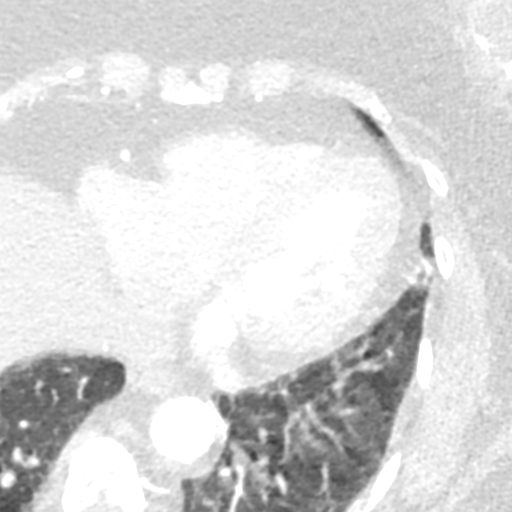
[im 204/306  vessel]
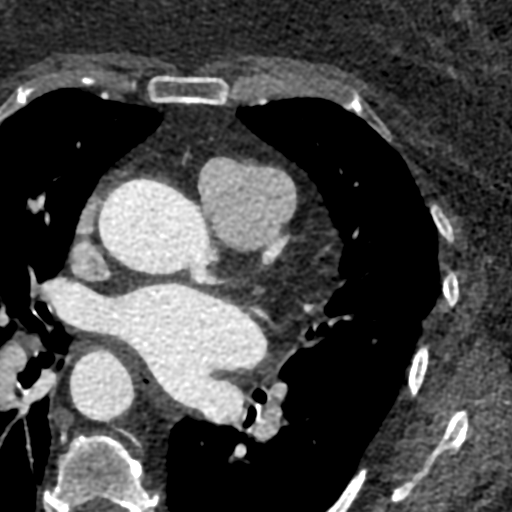

[Series 8: ts diast sharp 71 % · axial · 0.38mm/px · z∈[-188,-147]mm · 2 of 306 slices shown]
[im 102/306  lung]
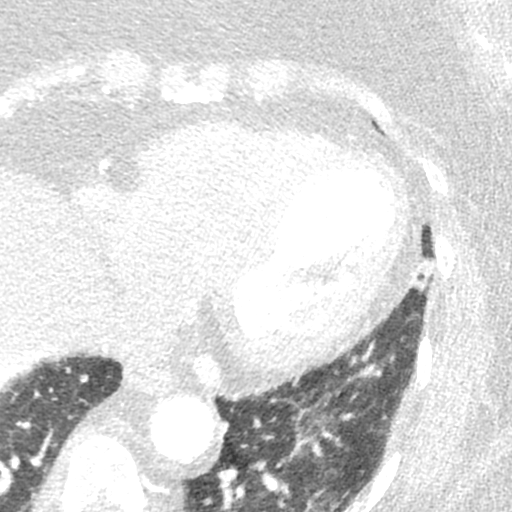
[im 204/306  lung]
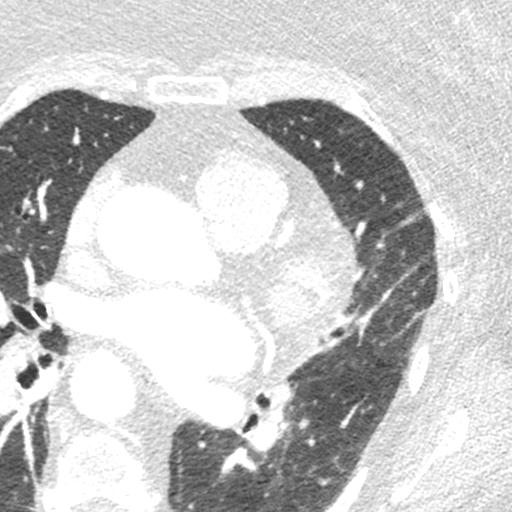

[Series 9: ts syst sharp · axial · 0.38mm/px · z∈[-188,-147]mm · 2 of 306 slices shown]
[im 102/306  lung]
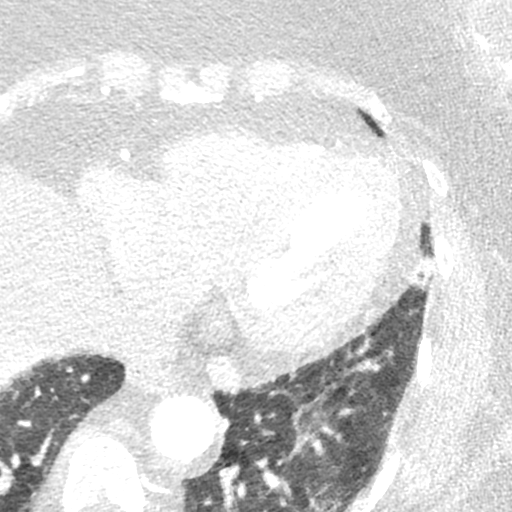
[im 204/306  lung]
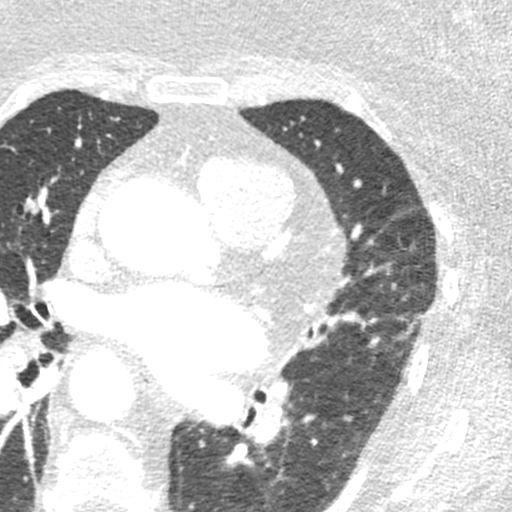

[8 of 20 positions shown; findings below may reference images not displayed]

FINDINGS: Aortic atherosclerosis. Within the visualized portions of the thorax
there are no suspicious appearing pulmonary nodules or masses, there
is no acute consolidative airspace disease, no pleural effusions, no
pneumothorax and no lymphadenopathy. Visualized portions of the
upper abdomen are unremarkable. There are no aggressive appearing
lytic or blastic lesions noted in the visualized portions of the
skeleton. Peripherally calcified centrally low-attenuation lesion in
the left breast measuring 4.1 x 4.3 cm, similar to the prior study.
IMPRESSION: 1. Aortic atherosclerosis.
2. Lesion in the left breast, as above, incompletely characterized.
Correlation with prior surgical history is recommended as this may
simply represent a postoperative seroma in the setting of prior
lumpectomy. Additional evaluation with mammography and/or breast
ultrasound may be warranted if clinically appropriate.
FINDINGS: A 100 kV prospective scan was triggered in the descending thoracic
aorta at 111 HU's. Axial non-contrast 3 mm slices were carried out
through the heart. The data set was analyzed on a dedicated work
station and scored using the Agatson method. Gantry rotation speed
was 250 msecs and collimation was .6 mm. 100 mg of PO Metoprolol and
0.8 mg of sl NTG was given. The 3D data set was reconstructed in 5%
intervals of the 67-82 % of the R-R cycle. Diastolic phases were
analyzed on a dedicated work station using MPR, MIP and VRT modes.
The patient received 80 cc of contrast.

Aorta: Normal size. Minimal calcifications, moderate diffuse
atherosclerotic plaque. No dissection.

Aortic Valve:  Trileaflet.  No calcifications.

Coronary Arteries:  Normal coronary origin.  Right dominance.

RCA is a large dominant artery that gives rise to PDA and PLA. There
is minimal calcified plaque in the proximal portion with stenosis
0-25%.

Left main is a large artery that gives rise to LAD and LCX arteries.
Left main has minimal calcified plaque in its distal portion with
stenosis 0-25%.

LAD is a large vessel that gives rise to a large diagonal artery.
Proximal LAD has mild non-calcified plaque with stenosis 25-49%.

D1 has minimal irregularities.

LCX is a non-dominant artery that gives rise to one large OM1
branch. There is no plaque.

Other findings:

Normal pulmonary vein drainage into the left atrium.

Normal left atrial appendage without a thrombus.
IMPRESSION: 1. Coronary calcium score of 35. This was 66 percentile for age and
sex matched control.

2. Normal coronary origin with right dominance.

3. CAD-RADS 2. Mild non-obstructive CAD (25-49%). Consider
non-atherosclerotic causes of chest pain. Consider preventive
therapy and risk factor modification.

4.  Mildly dilated pulmonary artery measuring 33 mm.

5.  PFO present.

*** End of Addendum ***
EXAM:
OVER-READ INTERPRETATION  CT CHEST

The following report is an over-read performed by radiologist Dr.
Esteek Cadramaisoso [REDACTED] on 05/08/2020. This
over-read does not include interpretation of cardiac or coronary
anatomy or pathology. The coronary calcium score/coronary CTA
interpretation by the cardiologist is attached.
FINDINGS: Aortic atherosclerosis. Within the visualized portions of the thorax
there are no suspicious appearing pulmonary nodules or masses, there
is no acute consolidative airspace disease, no pleural effusions, no
pneumothorax and no lymphadenopathy. Visualized portions of the
upper abdomen are unremarkable. There are no aggressive appearing
lytic or blastic lesions noted in the visualized portions of the
skeleton. Peripherally calcified centrally low-attenuation lesion in
the left breast measuring 4.1 x 4.3 cm, similar to the prior study.
IMPRESSION: 1. Aortic atherosclerosis.
2. Lesion in the left breast, as above, incompletely characterized.
Correlation with prior surgical history is recommended as this may
simply represent a postoperative seroma in the setting of prior
lumpectomy. Additional evaluation with mammography and/or breast
ultrasound may be warranted if clinically appropriate.

## 2021-12-14 ENCOUNTER — Other Ambulatory Visit: Payer: Self-pay | Admitting: Cardiology

## 2021-12-14 DIAGNOSIS — I2510 Atherosclerotic heart disease of native coronary artery without angina pectoris: Secondary | ICD-10-CM

## 2021-12-14 DIAGNOSIS — I10 Essential (primary) hypertension: Secondary | ICD-10-CM

## 2021-12-14 DIAGNOSIS — R0602 Shortness of breath: Secondary | ICD-10-CM

## 2021-12-14 DIAGNOSIS — E785 Hyperlipidemia, unspecified: Secondary | ICD-10-CM

## 2022-03-10 NOTE — Progress Notes (Signed)
Cardiology Office Note:    Date:  03/11/2022   ID:  Kristin Patel, DOB [birth year], MRN [id number]  PCP:  Gordy Councilman, FNP  Cardiologist:  Norman Herrlich, MD    Referring MD: Gordy Councilman, FNP    ASSESSMENT:    1. Mild CAD   2. Primary hypertension   3. Mixed hyperlipidemia   4. Agatston coronary artery calcium score less than 100    PLAN:    In order of problems listed above:  She is doing well with her CAD having no anginal discomfort on appropriate medical therapy including low-dose aspirin statin and antihypertensives. Well-controlled asked her to check her home blood pressure 1-2 times a week and record and bring to the office with any future Tolerates her statin she has not had recent labs in order to go ahead and do a lipid profile and CMP today Referred to orthopedic surgery at her request   Next appointment: 1 year   Medication Adjustments/Labs and Tests Ordered: Current medicines are reviewed at length with the patient today.  Concerns regarding medicines are outlined above.  No orders of the defined types were placed in this encounter.  No orders of the defined types were placed in this encounter.  Chief complaint follow-up CAD   History of Present Illness:    Kristin Patel is a 71 y.o. female with a hx of mild CAD shortness of breath associated with asthma hyperlipidemia and hypertension  last seen 01/20/2021. She underwent a cardiac CTA 05/08/2020 with a calcium score of 35/66 percentile for age and sex and mild nonobstructive CAD.  Compliance with diet, lifestyle and medications: Yes   Cardiology perspective she is doing well she is limited by knee pain and would like to see orthopedic surgery.  In the interim asked her to begin to use a recumbent bike to try to strengthen her thighs oftentimes will alleviate knee pain and improve gait and stability She is not having muscle pain or weakness Home blood pressure runs in the range of 130/70 She has had  no angina has not needed nitroglycerin no edema shortness of breath palpitation or syncope Although not listed she takes aspirin 81 mg daily Past Medical History:  Diagnosis Date   Asthma    Dyspnea    Morbid obesity (HCC)    Pre-diabetes     Past Surgical History:  Procedure Laterality Date   CESAREAN SECTION      Current Medications: Current Meds  Medication Sig   ADVAIR HFA 115-21 MCG/ACT inhaler Inhale 2 puffs into the lungs 2 (two) times daily.   albuterol (VENTOLIN HFA) 108 (90 Base) MCG/ACT inhaler Inhale into the lungs every 6 (six) hours as needed for wheezing or shortness of breath.   Aspirin-Acetaminophen-Caffeine (GOODY HEADACHE PO) Take 1 packet by mouth every 6 (six) hours as needed.   atorvastatin (LIPITOR) 20 MG tablet Take 1 tablet (20 mg total) by mouth daily.   diltiazem (CARDIZEM CD) 180 MG 24 hr capsule Take 1 capsule (180 mg total) by mouth daily.   Ibuprofen 200 MG CAPS Take 1 capsule by mouth every 6 (six) hours as needed.   Multiple Vitamin (MULTIVITAMIN PO) Take 1 tablet by mouth every other day.   nitroGLYCERIN (NITROSTAT) 0.4 MG SL tablet Place 0.4 mg under the tongue every 5 (five) minutes as needed for chest pain.     Allergies:   Patient has no known allergies.   Social History   Socioeconomic History   Marital status: Married  Spouse name: Not on file   Number of children: Not on file   Years of education: Not on file   Highest education level: Not on file  Occupational History   Not on file  Tobacco Use   Smoking status: Never   Smokeless tobacco: Never  Substance and Sexual Activity   Alcohol use: Never   Drug use: Never   Sexual activity: Not on file  Other Topics Concern   Not on file  Social History Narrative   Not on file   Social Determinants of Health   Financial Resource Strain: Not on file  Food Insecurity: Not on file  Transportation Needs: Not on file  Physical Activity: Not on file  Stress: Not on file  Social  Connections: Not on file     Family History: The patient's family history includes Congestive Heart Failure in her mother; Diabetes in her sister; Heart disease in her brother; Hypertension in her mother; Kidney cancer in her sister; Pancreatic cancer in her father. ROS:   Please see the history of present illness.    All other systems reviewed and are negative.  EKGs/Labs/Other Studies Reviewed:    The following studies were reviewed today:  EKG:  EKG ordered today and personally reviewed.  The ekg ordered today demonstrates sinus rhythm insignificant inferior Q waves in my opinion normal EKG with low voltage  Recent Labs: No results found for requested labs within last 365 days.  Recent Lipid Panel    Component Value Date/Time   CHOL 159 01/20/2021 1120   TRIG 177 (H) 01/20/2021 1120   HDL 45 01/20/2021 1120   CHOLHDL 3.5 01/20/2021 1120   LDLCALC 84 01/20/2021 1120    Physical Exam:    VS:  BP (!) 147/73   Pulse 89   Ht 5\' 5"  (1.651 m)   Wt 215 lb (97.5 kg)   SpO2 94%   BMI 35.78 kg/m    Repeat blood pressure by me 130/70 large cuff seated right arm Wt Readings from Last 3 Encounters:  03/11/22 215 lb (97.5 kg)  01/20/21 215 lb 9.6 oz (97.8 kg)  06/09/20 213 lb 6.4 oz (96.8 kg)     GEN:  Well nourished, well developed in no acute distress HEENT: Normal NECK: No JVD; No carotid bruits LYMPHATICS: No lymphadenopathy CARDIAC: RRR, no murmurs, rubs, gallops RESPIRATORY:  Clear to auscultation without rales, wheezing or rhonchi  ABDOMEN: Soft, non-tender, non-distended MUSCULOSKELETAL:  No edema; No deformity  SKIN: Warm and dry NEUROLOGIC:  Alert and oriented x 3 PSYCHIATRIC:  Normal affect    Signed, 08/09/20, MD  03/11/2022 10:31 AM    [facility]

## 2022-03-11 ENCOUNTER — Ambulatory Visit (INDEPENDENT_AMBULATORY_CARE_PROVIDER_SITE_OTHER): Payer: Medicare Other | Admitting: Cardiology

## 2022-03-11 ENCOUNTER — Encounter: Payer: Self-pay | Admitting: Cardiology

## 2022-03-11 VITALS — BP 147/73 | HR 89 | Ht 65.0 in | Wt 215.0 lb

## 2022-03-11 DIAGNOSIS — E782 Mixed hyperlipidemia: Secondary | ICD-10-CM | POA: Diagnosis not present

## 2022-03-11 DIAGNOSIS — I2510 Atherosclerotic heart disease of native coronary artery without angina pectoris: Secondary | ICD-10-CM

## 2022-03-11 DIAGNOSIS — R931 Abnormal findings on diagnostic imaging of heart and coronary circulation: Secondary | ICD-10-CM | POA: Diagnosis not present

## 2022-03-11 DIAGNOSIS — I10 Essential (primary) hypertension: Secondary | ICD-10-CM | POA: Diagnosis not present

## 2022-03-11 DIAGNOSIS — E785 Hyperlipidemia, unspecified: Secondary | ICD-10-CM

## 2022-03-11 DIAGNOSIS — R0602 Shortness of breath: Secondary | ICD-10-CM

## 2022-03-11 LAB — COMPREHENSIVE METABOLIC PANEL
ALT: 18 IU/L (ref 0–32)
AST: 15 IU/L (ref 0–40)
Albumin/Globulin Ratio: 1.6 (ref 1.2–2.2)
Albumin: 4.5 g/dL (ref 3.8–4.8)
Alkaline Phosphatase: 106 IU/L (ref 44–121)
BUN/Creatinine Ratio: 17 (ref 12–28)
BUN: 12 mg/dL (ref 8–27)
Bilirubin Total: 0.3 mg/dL (ref 0.0–1.2)
CO2: 24 mmol/L (ref 20–29)
Calcium: 9.7 mg/dL (ref 8.7–10.3)
Chloride: 103 mmol/L (ref 96–106)
Creatinine, Ser: 0.71 mg/dL (ref 0.57–1.00)
Globulin, Total: 2.9 g/dL (ref 1.5–4.5)
Glucose: 131 mg/dL — ABNORMAL HIGH (ref 70–99)
Potassium: 4.5 mmol/L (ref 3.5–5.2)
Sodium: 139 mmol/L (ref 134–144)
Total Protein: 7.4 g/dL (ref 6.0–8.5)
eGFR: 90 mL/min/{1.73_m2} (ref >59)

## 2022-03-11 LAB — LIPID PANEL
Chol/HDL Ratio: 3.5 ratio (ref 0.0–4.4)
Cholesterol, Total: 144 mg/dL (ref 100–199)
HDL: 41 mg/dL (ref >39)
LDL Chol Calc (NIH): 74 mg/dL (ref 0–99)
Triglycerides: 171 mg/dL — ABNORMAL HIGH (ref 0–149)
VLDL Cholesterol Cal: 29 mg/dL (ref 5–40)

## 2022-03-11 MED ORDER — ASPIRIN 81 MG PO TBEC: 81.0000 mg | DELAYED_RELEASE_TABLET | Freq: Every day | ORAL | 3 refills | Status: AC

## 2022-03-11 MED ORDER — DILTIAZEM HCL ER COATED BEADS 180 MG PO CP24: 180.0000 mg | ORAL_CAPSULE | Freq: Every day | ORAL | 3 refills | Status: DC

## 2022-03-11 NOTE — Patient Instructions (Signed)
Medication Instructions:  Your physician has recommended you make the following change in your medication:   START: Aspirin 81 mg daily  *If you need a refill on your cardiac medications before your next appointment, please call your pharmacy*   Lab Work: Your physician recommends that you return for lab work in:   Labs today: CMP, Lipids  If you have labs (blood work) drawn today and your tests are completely normal, you will receive your results only by: MyChart Message (if you have MyChart) OR A paper copy in the mail If you have any lab test that is abnormal or we need to change your treatment, we will call you to review the results.   Testing/Procedures: None   Follow-Up: At [facility], you and your health needs are our priority.  As part of our continuing mission to provide you with exceptional heart care, we have created designated Provider Care Teams.  These Care Teams include your primary Cardiologist (physician) and Advanced Practice Providers (APPs -  Physician Assistants and Nurse Practitioners) who all work together to provide you with the care you need, when you need it.  We recommend signing up for the patient portal called "MyChart".  Sign up information is provided on this After Visit Summary.  MyChart is used to connect with patients for Virtual Visits (Telemedicine).  Patients are able to view lab/test results, encounter notes, upcoming appointments, etc.  Non-urgent messages can be sent to your provider as well.   To learn more about what you can do with MyChart, go to [email or web address].    Your next appointment:   1 year(s)  The format for your next appointment:   In Person  Provider:   Norman Herrlich, MD    Other Instructions Start using a recombinant bike 5 - 10 minutes 3 times a week  Sign up in MyChart  Important Information About Sugar

## 2022-04-05 ENCOUNTER — Telehealth: Payer: Self-pay | Admitting: Cardiology

## 2022-04-05 NOTE — Telephone Encounter (Signed)
   Pre-operative Risk Assessment    Patient Name: Kristin Patel  DOB: 04-22-1951 MRN: [id number]      Request for Surgical Clearance    Procedure:   Total Knee Arthroplasty  Date of Surgery:  Clearance TBD                                 Surgeon: Dr. Georgena Spurling Surgeon's Group or Practice Name:  [facility] Phone number:  [phone] Fax number:  [phone]   Type of Clearance Requested:   - Medical    Type of Anesthesia:  Spinal   Additional requests/questions:  Please fax a copy of last OV note & pertinent records to the surgeon's office.  Signed, Bolivar Haw   04/05/2022, 5:03 PM

## 2022-04-06 NOTE — Telephone Encounter (Signed)
   Primary Cardiologist: None  Chart reviewed as part of pre-operative protocol coverage. Given past medical history and time since last visit, based on ACC/AHA guidelines, Kristin Patel would be at acceptable risk for the planned procedure without further cardiovascular testing.   She may hold aspirin 7 days prior to surgery and resume postoperatively as soon as deemed hemodynamically stable by surgeon.  I will route this recommendation to the requesting party via Epic fax function and remove from pre-op pool.  Please call with questions.  Levi Aland, NP-C    04/06/2022, 9:57 AM [facility] [address] Office [phone] Fax [phone]

## 2022-04-06 NOTE — Telephone Encounter (Signed)
Dr. Dulce Sellar, you saw this patient on 03/11/22 and she is now pending total knee arthroplasty. From your note, it does not appear that she was having any symptoms concerning for angina. Could you comment on cardiac clearance for surgery and do you agree that she may hold her aspirin for 7 days prior to surgery due to spinal anesthesia?  Please send your response to p cv div preop  Thank you, Levi Aland, NP-C    04/06/2022, 8:30 AM [facility] [address], [address] Office [phone] Fax [phone]

## 2022-05-19 ENCOUNTER — Other Ambulatory Visit: Payer: Self-pay | Admitting: Cardiology

## 2022-05-19 ENCOUNTER — Other Ambulatory Visit: Payer: Self-pay

## 2022-12-13 ENCOUNTER — Other Ambulatory Visit: Payer: Self-pay | Admitting: Cardiology

## 2022-12-13 DIAGNOSIS — I2510 Atherosclerotic heart disease of native coronary artery without angina pectoris: Secondary | ICD-10-CM

## 2022-12-13 DIAGNOSIS — R0602 Shortness of breath: Secondary | ICD-10-CM

## 2022-12-13 DIAGNOSIS — E785 Hyperlipidemia, unspecified: Secondary | ICD-10-CM

## 2022-12-13 DIAGNOSIS — I10 Essential (primary) hypertension: Secondary | ICD-10-CM

## 2023-02-18 ENCOUNTER — Other Ambulatory Visit: Payer: Self-pay | Admitting: Cardiology

## 2023-04-12 ENCOUNTER — Telehealth: Payer: Self-pay | Admitting: Cardiology

## 2023-04-12 ENCOUNTER — Ambulatory Visit: Payer: Medicare Other | Attending: Cardiology | Admitting: Cardiology

## 2023-04-12 ENCOUNTER — Encounter: Payer: Self-pay | Admitting: Cardiology

## 2023-04-12 VITALS — BP 122/82 | HR 87 | Ht 65.0 in | Wt 209.8 lb

## 2023-04-12 DIAGNOSIS — I10 Essential (primary) hypertension: Secondary | ICD-10-CM | POA: Insufficient documentation

## 2023-04-12 DIAGNOSIS — R0602 Shortness of breath: Secondary | ICD-10-CM | POA: Insufficient documentation

## 2023-04-12 DIAGNOSIS — R931 Abnormal findings on diagnostic imaging of heart and coronary circulation: Secondary | ICD-10-CM | POA: Diagnosis not present

## 2023-04-12 DIAGNOSIS — I2510 Atherosclerotic heart disease of native coronary artery without angina pectoris: Secondary | ICD-10-CM | POA: Insufficient documentation

## 2023-04-12 DIAGNOSIS — E782 Mixed hyperlipidemia: Secondary | ICD-10-CM | POA: Insufficient documentation

## 2023-04-12 MED ORDER — FUROSEMIDE 20 MG PO TABS: 20.0000 mg | ORAL_TABLET | Freq: Every day | ORAL | 3 refills | Status: AC

## 2023-04-12 NOTE — Telephone Encounter (Signed)
   Pre-operative Risk Assessment    Patient Name: Kristin Patel  DOB: 18-Nov-1950 MRN: [id number]      Request for Surgical Clearance    Procedure:   Knee Replacement   Date of Surgery:  Clearance TBD                                 Surgeon:  Georgena Spurling, MD Surgeon's Group or Practice Name:  [facility]  Phone number:  [phone] Fax number:  [phone]   Type of Clearance Requested:   - Medical    Type of Anesthesia:  Spinal   Additional requests/questions:    SignedBelva Bertin   04/12/2023, 2:16 PM

## 2023-04-12 NOTE — Patient Instructions (Signed)
Medication Instructions:  Your physician has recommended you make the following change in your medication:   START: Furosemide 20 mg daily  *If you need a refill on your cardiac medications before your next appointment, please call your pharmacy*   Lab Work: Your physician recommends that you return for lab work in:   Labs today: BMP, Pro BNP  If you have labs (blood work) drawn today and your tests are completely normal, you will receive your results only by: MyChart Message (if you have MyChart) OR A paper copy in the mail If you have any lab test that is abnormal or we need to change your treatment, we will call you to review the results.   Testing/Procedures: Your physician has requested that you have an echocardiogram. Echocardiography is a painless test that uses sound waves to create images of your heart. It provides your doctor with information about the size and shape of your heart and how well your heart's chambers and valves are working. This procedure takes approximately one hour. There are no restrictions for this procedure. Please do NOT wear cologne, perfume, aftershave, or lotions (deodorant is allowed). Please arrive 15 minutes prior to your appointment time.  A chest x-ray takes a picture of the organs and structures inside the chest, including the heart, lungs, and blood vessels. This test can show several things, including, whether the heart is enlarges; whether fluid is building up in the lungs; and whether pacemaker / defibrillator leads are still in place.    Follow-Up: At [facility], you and your health needs are our priority.  As part of our continuing mission to provide you with exceptional heart care, we have created designated Provider Care Teams.  These Care Teams include your primary Cardiologist (physician) and Advanced Practice Providers (APPs -  Physician Assistants and Nurse Practitioners) who all work together to provide you with the care you  need, when you need it.  We recommend signing up for the patient portal called "MyChart".  Sign up information is provided on this After Visit Summary.  MyChart is used to connect with patients for Virtual Visits (Telemedicine).  Patients are able to view lab/test results, encounter notes, upcoming appointments, etc.  Non-urgent messages can be sent to your provider as well.   To learn more about what you can do with MyChart, go to [email or web address].    Your next appointment:   6 week(s)  Provider:   Wallis Bamberg, NP Rosalita Levan)    Other Instructions Weigh daily and record

## 2023-04-12 NOTE — Progress Notes (Signed)
Cardiology Office Note:    Date:  04/12/2023   ID:  Kristin Patel, DOB 18-Jun-1951, MRN [id number]  PCP:  Gus Height, PA  Cardiologist:  Norman Herrlich, MD    Referring MD: Gordy Councilman, FNP    ASSESSMENT:    1. SOB (shortness of breath) on exertion   2. Mild CAD   3. Agatston coronary artery calcium score less than 100   4. Primary hypertension   5. Mixed hyperlipidemia    PLAN:    In order of problems listed above:  Shortness of breath is most consistent with heart failure peripheral edema not uncommon in women with mild CAD and hypertension.  It is unusual to have an oxygen saturation is low with heart failure and for further evaluation check proBNP level echocardiogram for left and right heart function pulmonary pressures and a chest x-ray for completeness especially prior to elective orthopedic surgery.  Provided these are reassuring and she is improved and follow-up she will be optimized for surgical procedure.  To alleviate symptoms start a low-dose of loop diuretic. Stable CAD she has had no anginal discomfort will continue treatment including aspirin and a rate limiting calcium channel blocker currently not on a statin Blood pressure at target continue current treatment Next visit address the deficit and statin therapy.  6 weeks   Next appointment: 6 weeks   Medication Adjustments/Labs and Tests Ordered: Current medicines are reviewed at length with the patient today.  Concerns regarding medicines are outlined above.  Orders Placed This Encounter  Procedures   DG Chest 2 View   Basic Metabolic Panel (BMET)   Pro b natriuretic peptide (BNP)   EKG 12-Lead   ECHOCARDIOGRAM COMPLETE   Meds ordered this encounter  Medications   furosemide (LASIX) 20 MG tablet    Sig: Take 1 tablet (20 mg total) by mouth daily.    Dispense:  90 tablet    Refill:  3     History of Present Illness:    Kristin Patel is a 71 y.o. female with a hx of mild CAD shortness of breath  associated asthma hyperlipidemia hypertension and a coronary artery calcium score 35/66 percentile with mild nonobstructive CAD 2021 on cardiac CTA last seen 03/11/2022.  Compliance with diet, lifestyle and medications: Yes  She is seen by me today in follow-up for CAD She alerts me that she is going to be having total knee arthroplasty Dr. Valentina Gu.  She has not scheduled. 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   albuterol (VENTOLIN HFA) 108 (90 Base) MCG/ACT inhaler Inhale into the lungs every 6 (six) hours as needed for wheezing or shortness of breath.   aspirin EC 81 MG tablet  Take 1 tablet (81 mg total) by mouth daily. Swallow whole.   Aspirin-Acetaminophen-Caffeine (GOODY HEADACHE PO) Take 1 packet by mouth every 6 (six) hours as needed.   atorvastatin (LIPITOR) 20 MG tablet Take 1 tablet (20 mg total) by mouth daily.   diltiazem (CARDIZEM CD) 180 MG 24 hr capsule TAKE ONE CAPSULE BY MOUTH ONCE DAILY   furosemide (LASIX) 20 MG tablet Take 1 tablet (20 mg total) by mouth daily.   Ibuprofen 200 MG CAPS Take 1 capsule by mouth every 6 (six) hours as needed.   metFORMIN (GLUCOPHAGE) 500 MG tablet Take 500 mg by mouth 2 (two) times daily.   Multiple Vitamin  (MULTIVITAMIN PO) Take 1 tablet by mouth every other day.   nitroGLYCERIN (NITROSTAT) 0.4 MG SL tablet Place 0.4 mg under the tongue every 5 (five) minutes as needed for chest pain.      EKGs/Labs/Other Studies Reviewed:    The following studies were reviewed today:  EKG Interpretation Date/Time:  Wednesday April 12 2023 08:09:55 EDT Ventricular Rate:  87 PR Interval:  164 QRS Duration:  72 QT Interval:  354 QTC Calculation: 425 R Axis:   -2  Text Interpretation: Normal sinus rhythm LOW VOLTAGE ABNORMAL R WAVE PROGRESSION No previous ECGs available Confirmed by Norman Herrlich ([id number]) on 04/12/2023 9:24:00 AM    Recent Labs: No results found for requested labs within last 365 days.  Recent Lipid Panel    Component Value Date/Time   CHOL 144 03/11/2022 1052   TRIG 171 (H) 03/11/2022 1052   HDL 41 03/11/2022 1052   CHOLHDL 3.5 03/11/2022 1052   LDLCALC 74 03/11/2022 1052    Physical Exam:    VS:  BP 122/82 (BP Location: Right Arm, Patient Position: Sitting, Cuff Size: Normal)   Pulse 87   Ht 5\' 5"  (1.651 m)   Wt 209 lb 12.8 oz (95.2 kg)   SpO2 95%   BMI 34.91 kg/m     Wt Readings from Last 3 Encounters:  04/12/23 209 lb 12.8 oz (95.2 kg)  03/11/22 215 lb (97.5 kg)  01/20/21 215 lb 9.6 oz (97.8 kg)     GEN:  well nourished, well developed in no acute distress HEENT: Normal NECK: No JVD; No carotid bruits LYMPHATICS: No lymphadenopathy CARDIAC: RRR, no murmurs, rubs, gallops RESPIRATORY:  Clear to auscultation without rales, wheezing or rhonchi  ABDOMEN: Soft, non-tender, non-distended MUSCULOSKELETAL: She has 2+ bilateral lower extremity pitting ankle to knee edema; No deformity  SKIN: Warm and dry NEUROLOGIC:  Alert and oriented x 3 PSYCHIATRIC:  Normal affect    Signed, Norman Herrlich, MD  04/12/2023 9:24 AM     [facility]

## 2023-04-13 LAB — BASIC METABOLIC PANEL
BUN/Creatinine Ratio: 19 (ref 12–28)
BUN: 10 mg/dL (ref 8–27)
CO2: 23 mmol/L (ref 20–29)
Calcium: 9.9 mg/dL (ref 8.7–10.3)
Chloride: 104 mmol/L (ref 96–106)
Creatinine, Ser: 0.53 mg/dL — ABNORMAL LOW (ref 0.57–1.00)
Glucose: 121 mg/dL — ABNORMAL HIGH (ref 70–99)
Potassium: 4.5 mmol/L (ref 3.5–5.2)
Sodium: 142 mmol/L (ref 134–144)
eGFR: 98 mL/min/{1.73_m2} (ref >59)

## 2023-04-13 LAB — PRO B NATRIURETIC PEPTIDE: NT-Pro BNP: 91 pg/mL (ref 0–301)

## 2023-04-14 ENCOUNTER — Telehealth: Payer: Self-pay | Admitting: Cardiology

## 2023-04-14 ENCOUNTER — Other Ambulatory Visit: Payer: Self-pay

## 2023-04-14 NOTE — Telephone Encounter (Signed)
Patient informed of results.  

## 2023-04-14 NOTE — Telephone Encounter (Signed)
Patient stated she is returning staff call.

## 2023-05-15 ENCOUNTER — Ambulatory Visit: Payer: Medicare Other | Attending: Cardiology

## 2023-05-15 DIAGNOSIS — R931 Abnormal findings on diagnostic imaging of heart and coronary circulation: Secondary | ICD-10-CM | POA: Insufficient documentation

## 2023-05-15 DIAGNOSIS — I2510 Atherosclerotic heart disease of native coronary artery without angina pectoris: Secondary | ICD-10-CM | POA: Diagnosis present

## 2023-05-15 DIAGNOSIS — E782 Mixed hyperlipidemia: Secondary | ICD-10-CM | POA: Diagnosis present

## 2023-05-15 DIAGNOSIS — I10 Essential (primary) hypertension: Secondary | ICD-10-CM | POA: Insufficient documentation

## 2023-05-15 LAB — ECHOCARDIOGRAM COMPLETE
P 1/2 time: 576 ms
S' Lateral: 3.2 cm

## 2023-05-17 NOTE — Telephone Encounter (Signed)
   Primary Cardiologist: Norman Herrlich, MD  Chart reviewed as part of pre-operative protocol coverage.Kristin Patel was seen in our office on 04/12/23 by Dr. Dulce Sellar and underwent echocardiogram for shortness of breath. Her test results have been reviewed and based on ACC/AHA guidelines, she would be at acceptable risk for the planned procedure without further cardiovascular testing.   Patient was advised that if she develops new symptoms prior to surgery to contact our office to arrange a follow-up appointment.  He verbalized understanding.  Per office protocol, she may hold aspirin for 5-7 days prior to procedure and should resume as soon as hemodynamically stable postoperatively.  I will route this recommendation to the requesting party via Epic fax function and remove from pre-op pool.  Please call with questions.  Levi Aland, NP-C 05/17/2023, 9:25 AM [address], [address] Office [phone] Fax [phone]

## 2023-05-24 ENCOUNTER — Encounter: Payer: Self-pay | Admitting: Cardiology

## 2023-05-24 NOTE — Progress Notes (Unsigned)
Cardiology Office Note:  .   Date:  05/25/2023  ID:  Kristin Patel, DOB 02-Feb-1951, MRN [id number] PCP: Gus Height, PA   [facility] Providers Cardiologist:  Norman Herrlich, MD    History of Present Illness: .   Kristin Patel is a 72 y.o. female with a past medical history of nonobstructive CAD, hypertension, dyslipidemia, PFO.  05/09/2020 coronary CTA calcium score 35 placing her in the 66 percentile, mild nonobstructive CAD  She was evaluated by Dr. Dulce Sellar on 04/12/2023 shortness of breath that was felt to be consistent with heart failure.  proBNP was 91. Echocardiogram was obtained which revealed an EF 60 to 65%, mild LVH, grade 1 DD, mild MR, mild AR.  She was started on low-dose diuretic and advised to follow-up in 6 weeks.  She presents today for follow-up after recent testing as outlined above.  She feels like her breathing may be somewhat better since she was started on low-dose diuretic.  She states she has been feeling short of breath for several years, overall she is unchanged.  She does follow with a pulmonologist.  She has upcoming knee replacement which is prohibiting her from being more active, does feel like there could be a component of deconditioning involved as well.  Her blood pressure is elevated in the office today however she states when she checks it at home is typically well-controlled, it was also well-controlled at her last OV, for now we will continue to monitor this.  She has some pedal edema however it is improved since she was started on Lasix.  She denies chest pain, palpitations, dyspnea, pnd, orthopnea, n, v, dizziness, syncope, weight gain, or early satiety.   ROS: Review of Systems  Respiratory:  Positive for shortness of breath.   Cardiovascular:  Positive for leg swelling.     Studies Reviewed: .        Cardiac Studies & Procedures       ECHOCARDIOGRAM  ECHOCARDIOGRAM COMPLETE 05/15/2023  Narrative ECHOCARDIOGRAM REPORT    Patient Name:    Kristin Patel Date of Exam: 05/15/2023 Medical Rec #:  [id number]     Height:       65.0 in Accession #:    [id number]    Weight:       209.8 lb Date of Birth:  07-Mar-1951      BSA:          2.019 m Patient Age:    72 years      BP:           122/82 mmHg Patient Gender: F             HR:           85 bpm. Exam Location:  [facility]  Procedure: 2D Echo, Cardiac Doppler, Color Doppler and Strain Analysis  Indications:    Mild CAD [I25.10 (ICD-10-CM)]; Agatston coronary artery calcium score less than 100 [R93.1 (ICD-10-CM)]; Primary hypertension [I10 (ICD-10-CM)]; Mixed hyperlipidemia [E78.2 (ICD-10-CM)]  History:        Patient has no prior history of Echocardiogram examinations. Signs/Symptoms:Dyspnea; Risk Factors:Pre-diabetes and Hypertension.  Sonographer:    Louie Boston RDCS Referring Phys: [id number] BRIAN J MUNLEY  IMPRESSIONS   1. Left ventricular ejection fraction, by estimation, is 60 to 65%. The left ventricle has normal function. The left ventricle has no regional wall motion abnormalities. There is mild left ventricular hypertrophy. Left ventricular diastolic parameters are consistent with Grade I diastolic dysfunction (impaired relaxation). 2. Right ventricular systolic function is  normal. The right ventricular size is normal. There is normal pulmonary artery systolic pressure. 3. The mitral valve is normal in structure. Mild mitral valve regurgitation. No evidence of mitral stenosis. 4. The aortic valve is normal in structure. Aortic valve regurgitation is mild. No aortic stenosis is present. 5. The inferior vena cava is normal in size with greater than 50% respiratory variability, suggesting right atrial pressure of 3 mmHg.  FINDINGS Left Ventricle: Left ventricular ejection fraction, by estimation, is 60 to 65%. The left ventricle has normal function. The left ventricle has no regional wall motion abnormalities. The left ventricular internal cavity size was normal in size. There  is mild left ventricular hypertrophy. Left ventricular diastolic parameters are consistent with Grade I diastolic dysfunction (impaired relaxation).  Right Ventricle: The right ventricular size is normal. No increase in right ventricular wall thickness. Right ventricular systolic function is normal. There is normal pulmonary artery systolic pressure. The tricuspid regurgitant velocity is 1.44 m/s, and with an assumed right atrial pressure of 3 mmHg, the estimated right ventricular systolic pressure is 11.3 mmHg.  Left Atrium: Left atrial size was normal in size.  Right Atrium: Right atrial size was normal in size.  Pericardium: There is no evidence of pericardial effusion.  Mitral Valve: The mitral valve is normal in structure. Mild mitral valve regurgitation. No evidence of mitral valve stenosis.  Tricuspid Valve: The tricuspid valve is normal in structure. Tricuspid valve regurgitation is not demonstrated. No evidence of tricuspid stenosis.  Aortic Valve: The aortic valve is normal in structure. Aortic valve regurgitation is mild. Aortic regurgitation PHT measures 576 msec. No aortic stenosis is present.  Pulmonic Valve: The pulmonic valve was normal in structure. Pulmonic valve regurgitation is not visualized. No evidence of pulmonic stenosis.  Aorta: The aortic root is normal in size and structure.  Venous: The inferior vena cava is normal in size with greater than 50% respiratory variability, suggesting right atrial pressure of 3 mmHg.  IAS/Shunts: No atrial level shunt detected by color flow Doppler.   LEFT VENTRICLE PLAX 2D LVIDd:         4.40 cm   Diastology LVIDs:         3.20 cm   LV e' medial:    6.64 cm/s LV PW:         1.20 cm   LV E/e' medial:  11.6 LV IVS:        1.20 cm   LV e' lateral:   12.00 cm/s LVOT diam:     2.10 cm   LV E/e' lateral: 6.4 LV SV:         86 LV SV Index:   43 LVOT Area:     3.46 cm   RIGHT VENTRICLE             IVC RV Basal diam:  3.00 cm      IVC diam: 1.90 cm RV S prime:     10.90 cm/s TAPSE (M-mode): 2.0 cm  LEFT ATRIUM             Index        RIGHT ATRIUM           Index LA diam:        3.60 cm 1.78 cm/m   RA Area:     12.20 cm LA Vol (A2C):   48.9 ml 24.22 ml/m  RA Volume:   29.00 ml  14.37 ml/m LA Vol (A4C):   21.8 ml 10.80 ml/m LA Biplane  Vol: 32.7 ml 16.20 ml/m AORTIC VALVE LVOT Vmax:   129.50 cm/s LVOT Vmean:  88.800 cm/s LVOT VTI:    0.249 m AI PHT:      576 msec  AORTA Ao Root diam: 2.50 cm Ao Asc diam:  3.20 cm Ao Desc diam: 2.10 cm  MV E velocity: 77.00 cm/s   TRICUSPID VALVE MV A velocity: 103.00 cm/s  TR Peak grad:   8.3 mmHg MV E/A ratio:  0.75         TR Vmax:        144.00 cm/s  SHUNTS Systemic VTI:  0.25 m Systemic Diam: 2.10 cm  Gypsy Balsam MD Electronically signed by Gypsy Balsam MD Signature Date/Time: 05/15/2023/11:09:59 AM    Final     CT SCANS  CT CORONARY MORPH W/CTA COR W/SCORE 05/08/2020  Addendum 05/09/2020 11:57 AM ADDENDUM REPORT: 05/09/2020 11:54  CLINICAL DATA:  72 year old female with shortness of breath.  EXAM: Cardiac/Coronary  CTA  TECHNIQUE: The patient was scanned on a [employer].  FINDINGS: A 100 kV prospective scan was triggered in the descending thoracic aorta at 111 HU's. Axial non-contrast 3 mm slices were carried out through the heart. The data set was analyzed on a dedicated work station and scored using the Agatson method. Gantry rotation speed was 250 msecs and collimation was .6 mm. 100 mg of PO Metoprolol and 0.8 mg of sl NTG was given. The 3D data set was reconstructed in 5% intervals of the 67-82 % of the R-R cycle. Diastolic phases were analyzed on a dedicated work station using MPR, MIP and VRT modes. The patient received 80 cc of contrast.  Aorta: Normal size. Minimal calcifications, moderate diffuse atherosclerotic plaque. No dissection.  Aortic Valve:  Trileaflet.  No calcifications.  Coronary Arteries:   Normal coronary origin.  Right dominance.  RCA is a large dominant artery that gives rise to PDA and PLA. There is minimal calcified plaque in the proximal portion with stenosis 0-25%.  Left main is a large artery that gives rise to LAD and LCX arteries. Left main has minimal calcified plaque in its distal portion with stenosis 0-25%.  LAD is a large vessel that gives rise to a large diagonal artery. Proximal LAD has mild non-calcified plaque with stenosis 25-49%.  D1 has minimal irregularities.  LCX is a non-dominant artery that gives rise to one large OM1 branch. There is no plaque.  Other findings:  Normal pulmonary vein drainage into the left atrium.  Normal left atrial appendage without a thrombus.  IMPRESSION: 1. Coronary calcium score of 35. This was 35 percentile for age and sex matched control.  2. Normal coronary origin with right dominance.  3. CAD-RADS 2. Mild non-obstructive CAD (25-49%). Consider non-atherosclerotic causes of chest pain. Consider preventive therapy and risk factor modification.  4.  Mildly dilated pulmonary artery measuring 33 mm.  5.  PFO present.   Electronically Signed By: Tobias Alexander On: 05/09/2020 11:54  Narrative EXAM: OVER-READ INTERPRETATION  CT CHEST  The following report is an over-read performed by radiologist Dr. Trudie Reed of [facility] on 05/08/2020. This over-read does not include interpretation of cardiac or coronary anatomy or pathology. The coronary calcium score/coronary CTA interpretation by the cardiologist is attached.  COMPARISON:  Chest CT 04/29/2020.  FINDINGS: Aortic atherosclerosis. Within the visualized portions of the thorax there are no suspicious appearing pulmonary nodules or masses, there is no acute consolidative airspace disease, no pleural effusions, no pneumothorax and no lymphadenopathy.  Visualized portions of the upper abdomen are unremarkable. There are no  aggressive appearing lytic or blastic lesions noted in the visualized portions of the skeleton. Peripherally calcified centrally low-attenuation lesion in the left breast measuring 4.1 x 4.3 cm, similar to the prior study.  IMPRESSION: 1. Aortic atherosclerosis. 2. Lesion in the left breast, as above, incompletely characterized. Correlation with prior surgical history is recommended as this may simply represent a postoperative seroma in the setting of prior lumpectomy. Additional evaluation with mammography and/or breast ultrasound may be warranted if clinically appropriate.  Electronically Signed: By: Trudie Reed M.D. On: 05/08/2020 14:11          Risk Assessment/Calculations:          Physical Exam:   VS:  BP (!) 161/84 (BP Location: Left Arm, Patient Position: Sitting, Cuff Size: Large)   Pulse 87   Ht 5\' 5"  (1.651 m)   Wt 205 lb (93 kg)   SpO2 96%   BMI 34.11 kg/m    Wt Readings from Last 3 Encounters:  05/25/23 205 lb (93 kg)  04/12/23 209 lb 12.8 oz (95.2 kg)  03/11/22 215 lb (97.5 kg)    GEN: Well nourished, well developed in no acute distress NECK: No JVD; No carotid bruits CARDIAC: RRR, no murmurs, rubs, gallops RESPIRATORY:  Clear to auscultation without rales, wheezing or rhonchi  ABDOMEN: Soft, non-tender, non-distended EXTREMITIES:  No edema; No deformity   ASSESSMENT AND PLAN: .   Coronary artery disease-nonobstructive per coronary CTA in 2021.  Continue aspirin 81 mg daily, continue Lipitor 20 mg daily, continue nitroglycerin as needed. Stable with no anginal symptoms. No indication for ischemic evaluation.  Heart healthy diet and regular cardiovascular exercise encouraged.    Hypertension-blood pressure today is elevated today at 161/84 however was well controlled at last OV, she sporadically checks her BP at home and feels it is well controlled at home, continue diltiazem 180 mg daily.  Pedal edema-echo revealed preserved EF, grade 1 DD, mild MR,  mild AR.  She was started on Lasix 20 mg daily and this has helped with her pedal edema.  Will repeat her BMET today to assess renal function and potassium.  Asthma-follows with pulmonology, breathing feels at baseline to her today.      Dispo: BMET, follow up in 1 year.     Signed, Flossie Dibble, NP

## 2023-05-25 ENCOUNTER — Ambulatory Visit: Payer: Medicare Other | Attending: Cardiology | Admitting: Cardiology

## 2023-05-25 ENCOUNTER — Encounter: Payer: Self-pay | Admitting: Cardiology

## 2023-05-25 VITALS — BP 161/84 | HR 87 | Ht 65.0 in | Wt 205.0 lb

## 2023-05-25 DIAGNOSIS — I2510 Atherosclerotic heart disease of native coronary artery without angina pectoris: Secondary | ICD-10-CM

## 2023-05-25 DIAGNOSIS — J45909 Unspecified asthma, uncomplicated: Secondary | ICD-10-CM

## 2023-05-25 DIAGNOSIS — E785 Hyperlipidemia, unspecified: Secondary | ICD-10-CM | POA: Diagnosis present

## 2023-05-25 DIAGNOSIS — I10 Essential (primary) hypertension: Secondary | ICD-10-CM | POA: Diagnosis not present

## 2023-05-25 DIAGNOSIS — R0602 Shortness of breath: Secondary | ICD-10-CM | POA: Diagnosis present

## 2023-05-25 MED ORDER — ATORVASTATIN CALCIUM 20 MG PO TABS: 20.0000 mg | ORAL_TABLET | Freq: Every day | ORAL | 3 refills | Status: DC

## 2023-05-25 MED ORDER — DILTIAZEM HCL ER COATED BEADS 180 MG PO CP24: 180.0000 mg | ORAL_CAPSULE | Freq: Every day | ORAL | 3 refills | Status: DC

## 2023-05-25 NOTE — Patient Instructions (Signed)
Medication Instructions:  Your physician recommends that you continue on your current medications as directed. Please refer to the Current Medication list given to you today.  *If you need a refill on your cardiac medications before your next appointment, please call your pharmacy*   Lab Work: Your physician recommends that you return for lab work in: Today for BMP  If you have labs (blood work) drawn today and your tests are completely normal, you will receive your results only by: MyChart Message (if you have MyChart) OR A paper copy in the mail If you have any lab test that is abnormal or we need to change your treatment, we will call you to review the results.   Testing/Procedures: NONE   Follow-Up: At [facility], you and your health needs are our priority.  As part of our continuing mission to provide you with exceptional heart care, we have created designated Provider Care Teams.  These Care Teams include your primary Cardiologist (physician) and Advanced Practice Providers (APPs -  Physician Assistants and Nurse Practitioners) who all work together to provide you with the care you need, when you need it.  We recommend signing up for the patient portal called "MyChart".  Sign up information is provided on this After Visit Summary.  MyChart is used to connect with patients for Virtual Visits (Telemedicine).  Patients are able to view lab/test results, encounter notes, upcoming appointments, etc.  Non-urgent messages can be sent to your provider as well.   To learn more about what you can do with MyChart, go to [email or web address].    Your next appointment:   1 year(s)  Provider:   Norman Herrlich, MD    Other Instructions

## 2023-05-26 ENCOUNTER — Telehealth: Payer: Self-pay

## 2023-05-26 LAB — BASIC METABOLIC PANEL WITH GFR
BUN/Creatinine Ratio: 18 (ref 12–28)
BUN: 9 mg/dL (ref 8–27)
CO2: 26 mmol/L (ref 20–29)
Calcium: 9.7 mg/dL (ref 8.7–10.3)
Chloride: 102 mmol/L (ref 96–106)
Creatinine, Ser: 0.51 mg/dL — ABNORMAL LOW (ref 0.57–1.00)
Glucose: 111 mg/dL — ABNORMAL HIGH (ref 70–99)
Potassium: 4.1 mmol/L (ref 3.5–5.2)
Sodium: 141 mmol/L (ref 134–144)
eGFR: 98 mL/min/1.73

## 2023-05-26 NOTE — Telephone Encounter (Signed)
LVM per DPR- per [facility]  note regarding normal Lab results. Encouraged to call with any questions. Routed to PCP.

## 2023-06-08 ENCOUNTER — Encounter: Payer: Self-pay | Admitting: Cardiology

## 2024-03-29 ENCOUNTER — Other Ambulatory Visit: Payer: Self-pay | Admitting: Cardiology

## 2024-05-17 NOTE — Progress Notes (Unsigned)
 Cardiology Office Note:  .   Date:  05/21/2024  ID:  Kristin Patel, DOB Jul 17, 1951, MRN [id number] PCP: Vicci Odor, PA  [facility] Providers Cardiologist:  Redell Leiter, MD    History of Present Illness: .   Kristin Patel is a 73 y.o. female with a past medical history of nonobstructive CAD, hypertension, dyslipidemia, PFO, mild MR, mild aortic valve regurgitation, DM2.  05/15/2023 echo EF 60 to 65%, mild concentric LVH, grade 1 DD, mild MR, mild aortic valve regurgitation 05/09/2020 coronary CTA calcium  score 35 placing her in the 66 percentile, mild nonobstructive CAD  She established care with Dr. Leiter in 2021 for the evaluation of shortness of breath at the behest of her pulmonologist.  She had a CT of her chest in 2018 revealing three-vessel coronary artery calcifications and so a coronary CTA was arranged revealing a calcium  score 35, placing her in the 66 percentile, mild nonobstructive CAD.    Most recently she was evaluated by myself on 05/25/2023, she felt like her breathing was improved since starting on a low-dose diuretic, her blood pressure was elevated in the office however she stated it was well-controlled at home, no changes made to medications or plan of care and she was advised she can follow-up in 1 year.  She presents today for follow-up, has been doing well since she was last evaluated in our office without any formal complaints. She has recently joined the gym, working out at least once per week, sometimes more often. She takes her lasix  as needed for pedal edema, hasn't needed in ~ 2 weeks. She denies chest pain, palpitations, dyspnea, pnd, orthopnea, n, v, dizziness, syncope, edema, weight gain, or early satiety.   ROS: ROS   Studies Reviewed: .        Cardiac Studies & Procedures   ______________________________________________________________________________________________     ECHOCARDIOGRAM  ECHOCARDIOGRAM COMPLETE  05/15/2023  Narrative ECHOCARDIOGRAM REPORT    Patient Name:   Kristin Patel Date of Exam: 05/15/2023 Medical Rec #:  [id number]     Height:       65.0 in Accession #:    [id number]    Weight:       209.8 lb Date of Birth:  05/05/1951      BSA:          2.019 m Patient Age:    72 years      BP:           122/82 mmHg Patient Gender: F             HR:           85 bpm. Exam Location:  [place]  Procedure: 2D Echo, Cardiac Doppler, Color Doppler and Strain Analysis  Indications:    Mild CAD [I25.10 (ICD-10-CM)]; Agatston coronary artery calcium  score less than 100 [R93.1 (ICD-10-CM)]; Primary hypertension [I10 (ICD-10-CM)]; Mixed hyperlipidemia [E78.2 (ICD-10-CM)]  History:        Patient has no prior history of Echocardiogram examinations. Signs/Symptoms:Dyspnea; Risk Factors:Pre-diabetes and Hypertension.  Sonographer:    Lynwood Silvas RDCS Referring Phys: [id number] BRIAN J MUNLEY  IMPRESSIONS   1. Left ventricular ejection fraction, by estimation, is 60 to 65%. The left ventricle has normal function. The left ventricle has no regional wall motion abnormalities. There is mild left ventricular hypertrophy. Left ventricular diastolic parameters are consistent with Grade I diastolic dysfunction (impaired relaxation). 2. Right ventricular systolic function is normal. The right ventricular size is normal. There is normal pulmonary artery systolic pressure. 3.  The mitral valve is normal in structure. Mild mitral valve regurgitation. No evidence of mitral stenosis. 4. The aortic valve is normal in structure. Aortic valve regurgitation is mild. No aortic stenosis is present. 5. The inferior vena cava is normal in size with greater than 50% respiratory variability, suggesting right atrial pressure of 3 mmHg.  FINDINGS Left Ventricle: Left ventricular ejection fraction, by estimation, is 60 to 65%. The left ventricle has normal function. The left ventricle has no regional wall motion abnormalities.  The left ventricular internal cavity size was normal in size. There is mild left ventricular hypertrophy. Left ventricular diastolic parameters are consistent with Grade I diastolic dysfunction (impaired relaxation).  Right Ventricle: The right ventricular size is normal. No increase in right ventricular wall thickness. Right ventricular systolic function is normal. There is normal pulmonary artery systolic pressure. The tricuspid regurgitant velocity is 1.44 m/s, and with an assumed right atrial pressure of 3 mmHg, the estimated right ventricular systolic pressure is 11.3 mmHg.  Left Atrium: Left atrial size was normal in size.  Right Atrium: Right atrial size was normal in size.  Pericardium: There is no evidence of pericardial effusion.  Mitral Valve: The mitral valve is normal in structure. Mild mitral valve regurgitation. No evidence of mitral valve stenosis.  Tricuspid Valve: The tricuspid valve is normal in structure. Tricuspid valve regurgitation is not demonstrated. No evidence of tricuspid stenosis.  Aortic Valve: The aortic valve is normal in structure. Aortic valve regurgitation is mild. Aortic regurgitation PHT measures 576 msec. No aortic stenosis is present.  Pulmonic Valve: The pulmonic valve was normal in structure. Pulmonic valve regurgitation is not visualized. No evidence of pulmonic stenosis.  Aorta: The aortic root is normal in size and structure.  Venous: The inferior vena cava is normal in size with greater than 50% respiratory variability, suggesting right atrial pressure of 3 mmHg.  IAS/Shunts: No atrial level shunt detected by color flow Doppler.   LEFT VENTRICLE PLAX 2D LVIDd:         4.40 cm   Diastology LVIDs:         3.20 cm   LV e' medial:    6.64 cm/s LV PW:         1.20 cm   LV E/e' medial:  11.6 LV IVS:        1.20 cm   LV e' lateral:   12.00 cm/s LVOT diam:     2.10 cm   LV E/e' lateral: 6.4 LV SV:         86 LV SV Index:   43 LVOT Area:      3.46 cm   RIGHT VENTRICLE             IVC RV Basal diam:  3.00 cm     IVC diam: 1.90 cm RV S prime:     10.90 cm/s TAPSE (M-mode): 2.0 cm  LEFT ATRIUM             Index        RIGHT ATRIUM           Index LA diam:        3.60 cm 1.78 cm/m   RA Area:     12.20 cm LA Vol (A2C):   48.9 ml 24.22 ml/m  RA Volume:   29.00 ml  14.37 ml/m LA Vol (A4C):   21.8 ml 10.80 ml/m LA Biplane Vol: 32.7 ml 16.20 ml/m AORTIC VALVE LVOT Vmax:   129.50 cm/s LVOT Vmean:  88.800 cm/s LVOT VTI:    0.249 m AI PHT:      576 msec  AORTA Ao Root diam: 2.50 cm Ao Asc diam:  3.20 cm Ao Desc diam: 2.10 cm  MV E velocity: 77.00 cm/s   TRICUSPID VALVE MV A velocity: 103.00 cm/s  TR Peak grad:   8.3 mmHg MV E/A ratio:  0.75         TR Vmax:        144.00 cm/s  SHUNTS Systemic VTI:  0.25 m Systemic Diam: 2.10 cm  Lamar Fitch MD Electronically signed by Lamar Fitch MD Signature Date/Time: 05/15/2023/11:09:59 AM    Final      CT SCANS  CT CORONARY MORPH W/CTA COR W/SCORE 05/08/2020  Addendum 05/09/2020 11:57 AM ADDENDUM REPORT: 05/09/2020 11:54  CLINICAL DATA:  73 year old female with shortness of breath.  EXAM: Cardiac/Coronary  CTA  TECHNIQUE: The patient was scanned on a [employer].  FINDINGS: A 100 kV prospective scan was triggered in the descending thoracic aorta at 111 HU's. Axial non-contrast 3 mm slices were carried out through the heart. The data set was analyzed on a dedicated work station and scored using the Agatson method. Gantry rotation speed was 250 msecs and collimation was .6 mm. 100 mg of PO Metoprolol  and 0.8 mg of sl NTG was given. The 3D data set was reconstructed in 5% intervals of the 67-82 % of the R-R cycle. Diastolic phases were analyzed on a dedicated work station using MPR, MIP and VRT modes. The patient received 80 cc of contrast.  Aorta: Normal size. Minimal calcifications, moderate diffuse atherosclerotic plaque. No  dissection.  Aortic Valve:  Trileaflet.  No calcifications.  Coronary Arteries:  Normal coronary origin.  Right dominance.  RCA is a large dominant artery that gives rise to PDA and PLA. There is minimal calcified plaque in the proximal portion with stenosis 0-25%.  Left main is a large artery that gives rise to LAD and LCX arteries. Left main has minimal calcified plaque in its distal portion with stenosis 0-25%.  LAD is a large vessel that gives rise to a large diagonal artery. Proximal LAD has mild non-calcified plaque with stenosis 25-49%.  D1 has minimal irregularities.  LCX is a non-dominant artery that gives rise to one large OM1 branch. There is no plaque.  Other findings:  Normal pulmonary vein drainage into the left atrium.  Normal left atrial appendage without a thrombus.  IMPRESSION: 1. Coronary calcium  score of 35. This was 62 percentile for age and sex matched control.  2. Normal coronary origin with right dominance.  3. CAD-RADS 2. Mild non-obstructive CAD (25-49%). Consider non-atherosclerotic causes of chest pain. Consider preventive therapy and risk factor modification.  4.  Mildly dilated pulmonary artery measuring 33 mm.  5.  PFO present.   Electronically Signed By: Leim Moose On: 05/09/2020 11:54  Narrative EXAM: OVER-READ INTERPRETATION  CT CHEST  The following report is an over-read performed by radiologist Dr. Toribio Aye of [facility] on 05/08/2020. This over-read does not include interpretation of cardiac or coronary anatomy or pathology. The coronary calcium  score/coronary CTA interpretation by the cardiologist is attached.  COMPARISON:  Chest CT 04/29/2020.  FINDINGS: Aortic atherosclerosis. Within the visualized portions of the thorax there are no suspicious appearing pulmonary nodules or masses, there is no acute consolidative airspace disease, no pleural effusions, no pneumothorax and no  lymphadenopathy. Visualized portions of the upper abdomen are unremarkable. There are no aggressive appearing lytic  or blastic lesions noted in the visualized portions of the skeleton. Peripherally calcified centrally low-attenuation lesion in the left breast measuring 4.1 x 4.3 cm, similar to the prior study.  IMPRESSION: 1. Aortic atherosclerosis. 2. Lesion in the left breast, as above, incompletely characterized. Correlation with prior surgical history is recommended as this may simply represent a postoperative seroma in the setting of prior lumpectomy. Additional evaluation with mammography and/or breast ultrasound may be warranted if clinically appropriate.  Electronically Signed: By: Toribio Aye M.D. On: 05/08/2020 14:11     ______________________________________________________________________________________________      Risk Assessment/Calculations:          Physical Exam:   VS:  BP (!) 142/72 (BP Location: Left Arm, Patient Position: Sitting, Cuff Size: Normal)   Pulse 83   Resp 16   Ht 5' 5 (1.651 m)   Wt 204 lb (92.5 kg)   SpO2 93%   BMI 33.95 kg/m    Wt Readings from Last 3 Encounters:  05/21/24 204 lb (92.5 kg)  05/25/23 205 lb (93 kg)  04/12/23 209 lb 12.8 oz (95.2 kg)    GEN: Well nourished, well developed in no acute distress NECK: No JVD; No carotid bruits CARDIAC: RRR, no murmurs, rubs, gallops RESPIRATORY:  Clear to auscultation without rales, wheezing or rhonchi  ABDOMEN: Soft, non-tender, non-distended EXTREMITIES:  No edema; No deformity   ASSESSMENT AND PLAN: .   Coronary artery disease-nonobstructive per coronary CTA in 2021.  Continue aspirin  81 mg daily, continue Lipitor 20 mg daily, continue nitroglycerin  as needed. Stable with no anginal symptoms. No indication for ischemic evaluation.  Heart healthy diet and regular cardiovascular exercise encouraged.    Hypertension-blood pressure today is elevated today at 142/72, however well  controlled at other providers appointments as well as at home. Continue diltiazem  180 mg daily.  Pedal edema-none appreciated today, lasix  20 mg as needed.   Asthma-follows with pulmonology, breathing feels at baseline to her today.    Dyslipidemia-formally followed by her PCP, labs earlier this month revealed an LDL of 48, continue Lipitor 20 mg daily.  DM2-most recent A1c was well-controlled at 6.1%, on Glucophage.    Dispo: Follow-up in 1 year.    Signed, Delon JAYSON Hoover, NP

## 2024-05-20 DIAGNOSIS — Q2112 Patent foramen ovale: Secondary | ICD-10-CM | POA: Insufficient documentation

## 2024-05-20 DIAGNOSIS — E785 Hyperlipidemia, unspecified: Secondary | ICD-10-CM | POA: Insufficient documentation

## 2024-05-21 ENCOUNTER — Ambulatory Visit: Attending: Cardiology | Admitting: Cardiology

## 2024-05-21 ENCOUNTER — Encounter: Payer: Self-pay | Admitting: Cardiology

## 2024-05-21 VITALS — BP 142/72 | HR 83 | Resp 16 | Ht 65.0 in | Wt 204.0 lb

## 2024-05-21 DIAGNOSIS — I2510 Atherosclerotic heart disease of native coronary artery without angina pectoris: Secondary | ICD-10-CM | POA: Diagnosis not present

## 2024-05-21 DIAGNOSIS — E1165 Type 2 diabetes mellitus with hyperglycemia: Secondary | ICD-10-CM | POA: Diagnosis present

## 2024-05-21 DIAGNOSIS — E785 Hyperlipidemia, unspecified: Secondary | ICD-10-CM | POA: Diagnosis present

## 2024-05-21 DIAGNOSIS — I10 Essential (primary) hypertension: Secondary | ICD-10-CM

## 2024-05-21 DIAGNOSIS — R0602 Shortness of breath: Secondary | ICD-10-CM | POA: Diagnosis not present

## 2024-05-21 NOTE — Patient Instructions (Signed)
 Medication Instructions:   No changes  *If you need a refill on your cardiac medications before your next appointment, please call your pharmacy*  Lab Work:  None today  If you have labs (blood work) drawn today and your tests are completely normal, you will receive your results only by: MyChart Message (if you have MyChart) OR A paper copy in the mail If you have any lab test that is abnormal or we need to change your treatment, we will call you to review the results.  Testing/Procedures:  None today  Follow-Up: At [facility], you and your health needs are our priority.  As part of our continuing mission to provide you with exceptional heart care, our providers are all part of one team.  This team includes your primary Cardiologist (physician) and Advanced Practice Providers or APPs (Physician Assistants and Nurse Practitioners) who all work together to provide you with the care you need, when you need it.  Your next appointment:   1 year(s)  Provider:   Redell Leiter, MD    We recommend signing up for the patient portal called MyChart.  Sign up information is provided on this After Visit Summary.  MyChart is used to connect with patients for Virtual Visits (Telemedicine).  Patients are able to view lab/test results, encounter notes, upcoming appointments, etc.  Non-urgent messages can be sent to your provider as well.   To learn more about what you can do with MyChart, go to [email or web address].   Other Instructions

## 2024-06-09 ENCOUNTER — Other Ambulatory Visit: Payer: Self-pay | Admitting: Cardiology

## 2024-06-09 DIAGNOSIS — R0602 Shortness of breath: Secondary | ICD-10-CM

## 2024-06-09 DIAGNOSIS — I10 Essential (primary) hypertension: Secondary | ICD-10-CM

## 2024-06-09 DIAGNOSIS — E785 Hyperlipidemia, unspecified: Secondary | ICD-10-CM

## 2024-06-09 DIAGNOSIS — I2510 Atherosclerotic heart disease of native coronary artery without angina pectoris: Secondary | ICD-10-CM

## 2024-09-11 ENCOUNTER — Other Ambulatory Visit: Payer: Self-pay

## 2024-09-11 MED ORDER — ATORVASTATIN CALCIUM 20 MG PO TABS: 20.0000 mg | ORAL_TABLET | Freq: Every day | ORAL | 1 refills | Status: AC
